# Patient Record
Sex: Female | Born: 1985 | Race: Black or African American | Hispanic: No | Marital: Single | Smoking: Former smoker
Health system: Southern US, Community
[De-identification: ages and names within clinical notes are randomized; demographics above are authoritative.]

## PROBLEM LIST (undated history)

## (undated) ENCOUNTER — Inpatient Hospital Stay (HOSPITAL_COMMUNITY): Payer: Self-pay

## (undated) DIAGNOSIS — K219 Gastro-esophageal reflux disease without esophagitis: Secondary | ICD-10-CM

## (undated) DIAGNOSIS — B001 Herpesviral vesicular dermatitis: Secondary | ICD-10-CM

## (undated) HISTORY — DX: Herpesviral vesicular dermatitis: B00.1

## (undated) HISTORY — PX: NO PAST SURGERIES: SHX2092

---
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
## 2006-10-11 ENCOUNTER — Emergency Department (HOSPITAL_COMMUNITY): Admission: EM | Admit: 2006-10-11 | Discharge: 2006-10-11 | Payer: Self-pay | Admitting: Emergency Medicine

## 2006-11-07 ENCOUNTER — Emergency Department (HOSPITAL_COMMUNITY): Admission: EM | Admit: 2006-11-07 | Discharge: 2006-11-07 | Payer: Self-pay | Admitting: Emergency Medicine

## 2007-02-05 ENCOUNTER — Emergency Department (HOSPITAL_COMMUNITY): Admission: EM | Admit: 2007-02-05 | Discharge: 2007-02-05 | Payer: Self-pay | Admitting: Emergency Medicine

## 2007-04-12 ENCOUNTER — Emergency Department (HOSPITAL_COMMUNITY): Admission: EM | Admit: 2007-04-12 | Discharge: 2007-04-12 | Payer: Self-pay | Admitting: Emergency Medicine

## 2010-01-29 ENCOUNTER — Emergency Department (HOSPITAL_COMMUNITY): Admission: EM | Admit: 2010-01-29 | Discharge: 2010-01-29 | Payer: Self-pay | Admitting: Emergency Medicine

## 2010-09-17 LAB — RAPID STREP SCREEN (MED CTR MEBANE ONLY): Streptococcus, Group A Screen (Direct): NEGATIVE

## 2010-09-17 LAB — STREP A DNA PROBE

## 2011-04-17 LAB — GC/CHLAMYDIA PROBE AMP, GENITAL
Chlamydia, DNA Probe: NEGATIVE
GC Probe Amp, Genital: NEGATIVE

## 2011-04-17 LAB — WET PREP, GENITAL: Trich, Wet Prep: NONE SEEN

## 2013-05-09 ENCOUNTER — Ambulatory Visit (INDEPENDENT_AMBULATORY_CARE_PROVIDER_SITE_OTHER): Payer: BC Managed Care – PPO | Admitting: Physician Assistant

## 2013-05-09 VITALS — BP 100/70 | HR 91 | Temp 98.9°F | Resp 16 | Ht 65.25 in | Wt 209.8 lb

## 2013-05-09 DIAGNOSIS — J329 Chronic sinusitis, unspecified: Secondary | ICD-10-CM

## 2013-05-09 DIAGNOSIS — B009 Herpesviral infection, unspecified: Secondary | ICD-10-CM

## 2013-05-09 DIAGNOSIS — J309 Allergic rhinitis, unspecified: Secondary | ICD-10-CM

## 2013-05-09 DIAGNOSIS — B001 Herpesviral vesicular dermatitis: Secondary | ICD-10-CM

## 2013-05-09 MED ORDER — AMOXICILLIN-POT CLAVULANATE 875-125 MG PO TABS: 1.0000 | ORAL_TABLET | Freq: Two times a day (BID) | ORAL | Status: DC

## 2013-05-09 MED ORDER — IPRATROPIUM BROMIDE 0.03 % NA SOLN: 2.0000 | Freq: Two times a day (BID) | NASAL | Status: DC

## 2013-05-09 MED ORDER — MUPIROCIN 2 % EX OINT: 1.0000 "application " | TOPICAL_OINTMENT | Freq: Three times a day (TID) | CUTANEOUS | Status: DC

## 2013-05-09 MED ORDER — FLUTICASONE PROPIONATE 50 MCG/ACT NA SUSP: 2.0000 | Freq: Every day | NASAL | Status: DC

## 2013-05-09 MED ORDER — GUAIFENESIN ER 1200 MG PO TB12: 1.0000 | ORAL_TABLET | Freq: Two times a day (BID) | ORAL | Status: DC | PRN

## 2013-05-09 NOTE — Progress Notes (Signed)
  Subjective:    Patient ID: Cindy Mendez, female    DOB: February 15, 1986, 27 y.o.   MRN: [id number]  HPI This 27 y.o. female presents for evaluation of respiratory illness and fever blisters. Developed severe nasal congestion on 05/03/2013, with progressive symptoms: sinus pressure and congestion, facial pain, HA.  OTC products, including nasal decongestant spray, without relief.  ST.  Feels flushed, subjective fever/chills.    Has a history of allergies, but hasn't been using her allergy meds: loratadine  She took 2 2-gram doses of Valtrex 12 hours apart with the onset of her fever blister, but hasn't had any improvement.  Review of Systems As above.    Objective:   Physical Exam  Vitals reviewed. Constitutional: She is oriented to person, place, and time. Vital signs are normal. She appears well-developed and well-nourished. She is active and cooperative. She appears ill. No distress.  HENT:  Head: Normocephalic and atraumatic.  Right Ear: Hearing, tympanic membrane, external ear and ear canal normal.  Left Ear: Hearing, tympanic membrane, external ear and ear canal normal.  Nose: Mucosal edema and rhinorrhea present.  No foreign bodies. Right sinus exhibits no maxillary sinus tenderness and no frontal sinus tenderness. Left sinus exhibits no maxillary sinus tenderness and no frontal sinus tenderness.  Mouth/Throat: Uvula is midline, oropharynx is clear and moist and mucous membranes are normal. No uvula swelling. No oropharyngeal exudate.  Small piercing in the RIGHT nare. Large cluster of vesicles on the upper lip extending from the center to the LEFT, associated with lip swelling and erythema.  Eyes: Conjunctivae and EOM are normal. Pupils are equal, round, and reactive to light. Right eye exhibits no discharge. Left eye exhibits no discharge. No scleral icterus.  Neck: Trachea normal, normal range of motion and full passive range of motion without pain. Neck supple. No mass and no  thyromegaly present.  Cardiovascular: Normal rate, regular rhythm and normal heart sounds.   Pulmonary/Chest: Effort normal and breath sounds normal.  Lymphadenopathy:       Head (right side): No submandibular, no tonsillar, no preauricular, no posterior auricular and no occipital adenopathy present.       Head (left side): No submandibular, no tonsillar, no preauricular and no occipital adenopathy present.    She has no cervical adenopathy.       Right: No supraclavicular adenopathy present.       Left: No supraclavicular adenopathy present.  Neurological: She is alert and oriented to person, place, and time. She has normal strength. No cranial nerve deficit or sensory deficit.  Skin: Skin is warm, dry and intact. No rash noted.  Psychiatric: She has a normal mood and affect. Her speech is normal and behavior is normal.          Assessment & Plan:  Sinusitis - Plan: ipratropium (ATROVENT) 0.03 % nasal spray, Guaifenesin (MUCINEX MAXIMUM STRENGTH) 1200 MG TB12, amoxicillin-clavulanate (AUGMENTIN) 875-125 MG per tablet  Herpes labialis - Plan: mupirocin ointment (BACTROBAN) 2 %, Valtrex prn  Allergic rhinitis - Plan: fluticasone (FLONASE) 50 MCG/ACT nasal spray  Supportive care.  Anticipatory guidance.  RTC if symptoms worsen/persist.  Fernande Bras, PA-C Physician Assistant-Certified [facility]

## 2013-05-09 NOTE — Patient Instructions (Signed)
Use a dose of the nasal decongestant in each nostril, and wait 10 minutes, then repeat. (Use 1-2 times daily for 3 days MAX) Wait 10 minutes, then use 2 sprays of the Atrovent (ipratropium) nasal spray in each nostril (Use 2 times each day until your symptoms improve.  You may restart it if your symptoms recur). Wait 10 minutes, use 2 sprays of the Flonase nasal spray (use daily to keep allergies well controlled).

## 2013-05-31 ENCOUNTER — Ambulatory Visit (INDEPENDENT_AMBULATORY_CARE_PROVIDER_SITE_OTHER): Payer: BC Managed Care – PPO | Admitting: Family Medicine

## 2013-05-31 VITALS — BP 110/70 | HR 88 | Temp 99.2°F | Resp 18 | Ht 64.38 in | Wt 204.0 lb

## 2013-05-31 DIAGNOSIS — J209 Acute bronchitis, unspecified: Secondary | ICD-10-CM

## 2013-05-31 MED ORDER — ALBUTEROL SULFATE HFA 108 (90 BASE) MCG/ACT IN AERS: 2.0000 | INHALATION_SPRAY | Freq: Four times a day (QID) | RESPIRATORY_TRACT | Status: AC | PRN

## 2013-05-31 MED ORDER — PREDNISONE 20 MG PO TABS: ORAL_TABLET | ORAL | Status: DC

## 2013-05-31 NOTE — Progress Notes (Signed)
This chart was scribed for Elvina Sidle, MD  by Arlan Organ, ED Scribe. This patient was seen in room Room 2 and the patient's care was started 10:40 AM.     Cindy Mendez MRN: [id number], DOB: 1985/08/10, 27 y.o. Date of Encounter: 05/31/2013, 10:29 AM  Primary Physician: No primary provider on file.  Chief Complaint:  Chief Complaint  Patient presents with  . Cough    yellow productive cough x 8-9 days  . Wheezing    HPI: 27 y.o. year old female presents with a 7 day history of gradual onset, gradually worsening, constant chest congestion, wheezing, and a productive cough consisting of yellow sputum. Afebrile. Has not tried OTC cold preps without success. No GI complaints. Appetite normal. Pt was recently seen on 05/09/13 for nasal congestion, and was given antibiotics with relief. She states her nasal symptoms are resolved, but feels her symptoms may have moved to her chest.  No sick contacts or recent travels.   No leg trauma, sedentary periods, h/o cancer. Pt states she smokes tobacco socially.  Pt currently works at [employer].  Past Medical History  Diagnosis Date  . Herpes simplex labialis      Home Meds: Prior to Admission medications   Medication Sig Start Date End Date Taking? Authorizing Provider  fluticasone (FLONASE) 50 MCG/ACT nasal spray Place 2 sprays into both nostrils daily. 05/09/13  Yes Chelle Tessa Lerner, PA-C  levonorgestrel-ethinyl estradiol (SEASONALE,INTROVALE,JOLESSA) 0.15-0.03 MG tablet Take 1 tablet by mouth daily.   Yes Historical Provider, MD  amoxicillin-clavulanate (AUGMENTIN) 875-125 MG per tablet Take 1 tablet by mouth 2 (two) times daily. 05/09/13   Chelle S Jeffery, PA-C  Guaifenesin (MUCINEX MAXIMUM STRENGTH) 1200 MG TB12 Take 1 tablet (1,200 mg total) by mouth every 12 (twelve) hours as needed. 05/09/13   Chelle S Jeffery, PA-C  ipratropium (ATROVENT) 0.03 % nasal spray Place 2 sprays into the nose 2 (two) times daily. 05/09/13    Chelle Tessa Lerner, PA-C  mupirocin ointment (BACTROBAN) 2 % Apply 1 application topically 3 (three) times daily. 05/09/13   Chelle S Jeffery, PA-C  valACYclovir (VALTREX) 1000 MG tablet Take 1,000 mg by mouth 2 (two) times daily. X 2 doses; 1/2 tablet daily for prevention    Historical Provider, MD    Allergies: No Known Allergies  History   Social History  . Marital Status: Single    Spouse Name: n/a    Number of Children: 0  . Years of Education: N/A   Occupational History  . bartender/server    Social History Main Topics  . Smoking status: Never Smoker   . Smokeless tobacco: Never Used  . Alcohol Use: 1 - 2 oz/week    2-4 drink(s) per week  . Drug Use: 4.00 per week    Special: Marijuana  . Sexual Activity: Not on file   Other Topics Concern  . Not on file   Social History Narrative   Lives with her mother and stepfather, and her brother.  Minimal contact with her father.       Review of Systems: Constitutional: negative for chills, fever, night sweats or weight changes Cardiovascular: negative for chest pain or palpitations Respiratory: negative for hemoptysis, or shortness of breath. Positive wheezing, and cough Abdominal: negative for abdominal pain, nausea, vomiting or diarrhea Dermatological: negative for rash Neurologic: negative for headache   Physical Exam: Blood pressure 110/70, pulse 88, temperature 99.2 F (37.3 C), temperature source Oral, resp. rate 18, height 5' 4.38" (1.635  m), weight 204 lb (92.534 kg), last menstrual period 03/08/2013, SpO2 98.00%., Body mass index is 34.62 kg/(m^2). General: Well developed, well nourished, in no acute distress. Head: Normocephalic, atraumatic, eyes without discharge, sclera non-icteric, nares are congested. Bilateral auditory canals clear, TM's are without perforation, pearly grey with reflective cone of light bilaterally. No sinus TTP. Oral cavity moist, dentition normal. Posterior pharynx with post nasal drip and  mild erythema. No peritonsillar abscess or tonsillar exudate. Neck: Supple. No thyromegaly. Full ROM. No lymphadenopathy. Lungs: Coarse breath sounds bilaterally without wheezes, rales, or rhonchi. Breathing is unlabored.  Heart: RRR with S1 S2. No murmurs, rubs, or gallops appreciated. Msk:  Strength and tone normal for age. Extremities: No clubbing or cyanosis. No edema. Neuro: Alert and oriented X 3. Moves all extremities spontaneously. CNII-XII grossly in tact. Psych:  Responds to questions appropriately with a normal affect.   Patient has inspiratory and expiratory wheezes bilaterally. Acute bronchitis - Plan: predniSONE (DELTASONE) 20 MG tablet, albuterol (PROVENTIL HFA;VENTOLIN HFA) 108 (90 BASE) MCG/ACT inhaler    ASSESSMENT AND PLAN:  27 y.o. year old female with bronchitis. -Acute bronchitis - Plan: predniSONE (DELTASONE) 20 MG tablet, albuterol (PROVENTIL HFA;VENTOLIN HFA) 108 (90 BASE) MCG/ACT inhaler   -Tylenol/Motrin prn -Rest/fluids -RTC precautions -RTC 3-5 days if no improvement  Signed, Elvina Sidle, MD 05/31/2013 10:29 AM

## 2013-06-19 ENCOUNTER — Emergency Department (HOSPITAL_COMMUNITY)
Admission: EM | Admit: 2013-06-19 | Discharge: 2013-06-19 | Disposition: A | Discharge status: Home / Self Care | Payer: BC Managed Care – PPO | Attending: Emergency Medicine | Admitting: Emergency Medicine

## 2013-06-19 ENCOUNTER — Encounter (HOSPITAL_COMMUNITY): Payer: Self-pay | Admitting: Emergency Medicine

## 2013-06-19 ENCOUNTER — Emergency Department (HOSPITAL_COMMUNITY): Payer: BC Managed Care – PPO

## 2013-06-19 DIAGNOSIS — R071 Chest pain on breathing: Secondary | ICD-10-CM | POA: Insufficient documentation

## 2013-06-19 DIAGNOSIS — J45909 Unspecified asthma, uncomplicated: Secondary | ICD-10-CM

## 2013-06-19 DIAGNOSIS — J209 Acute bronchitis, unspecified: Secondary | ICD-10-CM | POA: Insufficient documentation

## 2013-06-19 DIAGNOSIS — J40 Bronchitis, not specified as acute or chronic: Secondary | ICD-10-CM

## 2013-06-19 DIAGNOSIS — J3489 Other specified disorders of nose and nasal sinuses: Secondary | ICD-10-CM | POA: Insufficient documentation

## 2013-06-19 DIAGNOSIS — Z79899 Other long term (current) drug therapy: Secondary | ICD-10-CM | POA: Insufficient documentation

## 2013-06-19 DIAGNOSIS — R062 Wheezing: Secondary | ICD-10-CM | POA: Insufficient documentation

## 2013-06-19 DIAGNOSIS — Z8619 Personal history of other infectious and parasitic diseases: Secondary | ICD-10-CM | POA: Insufficient documentation

## 2013-06-19 DIAGNOSIS — Z8709 Personal history of other diseases of the respiratory system: Secondary | ICD-10-CM | POA: Insufficient documentation

## 2013-06-19 MED ORDER — IPRATROPIUM BROMIDE 0.02 % IN SOLN
0.5000 mg | Freq: Once | RESPIRATORY_TRACT | Status: AC
  Administered 2013-06-19: 0.5 mg via RESPIRATORY_TRACT
  Filled 2013-06-19: qty 2.5

## 2013-06-19 MED ORDER — METHYLPREDNISOLONE SODIUM SUCC 125 MG IJ SOLR
125.0000 mg | Freq: Once | INTRAMUSCULAR | Status: AC
  Administered 2013-06-19: 125 mg via INTRAMUSCULAR
  Filled 2013-06-19: qty 2

## 2013-06-19 MED ORDER — PREDNISONE 20 MG PO TABS: ORAL_TABLET | ORAL | Status: DC

## 2013-06-19 MED ORDER — ALBUTEROL SULFATE (5 MG/ML) 0.5% IN NEBU
5.0000 mg | INHALATION_SOLUTION | Freq: Once | RESPIRATORY_TRACT | Status: AC
  Administered 2013-06-19: 5 mg via RESPIRATORY_TRACT
  Filled 2013-06-19: qty 1

## 2013-06-19 MED ORDER — AZITHROMYCIN 250 MG PO TABS: 250.0000 mg | ORAL_TABLET | Freq: Every day | ORAL | Status: DC

## 2013-06-19 NOTE — ED Notes (Signed)
Pt reports she feels better and can breathe easier.

## 2013-06-19 NOTE — ED Provider Notes (Signed)
CSN: [id number]     Arrival date & time 06/19/13  1141 History  This chart was scribed for non-physician practitioner, Raymon Mutton, PA-C working with Raeford Razor, MD by Greggory Stallion, ED scribe. This patient was seen in room TR10C/TR10C and the patient's care was started at 3:43 PM.   Chief Complaint  Patient presents with  . Bronchitis   The history is provided by the patient. No language interpreter was used.   HPI Comments: Cindy Mendez is a 27 y.o. female who presents to the Emergency Department complaining of productive cough. She states she had a sinus infection, then was diagnosed with bronchitis one month ago and given an inhaler and prednisone. Pt finished the prednisone two weeks ago and states it provided some relief. Patient reported that when she coughs she has a sharp pain to the left side of her ribs, stated that it only occurs when she coughs. She has also had wheezing and used an inhaler with temporary relief. She uses the albuterol inhaler 3 times per day. Pt has taken Advil with no relief. Denies fever, chills, congestion, nausea, emesis, diarrhea, chest pain, sore throat, difficulty swallowing, fall, injury.   Past Medical History  Diagnosis Date  . Herpes simplex labialis    History reviewed. No pertinent past surgical history. Family History  Problem Relation Age of Onset  . Diabetes Maternal Grandfather   . Fibromyalgia Mother   . Asthma Father    History  Substance Use Topics  . Smoking status: Never Smoker   . Smokeless tobacco: Never Used  . Alcohol Use: 1 - 2 oz/week    2-4 drink(s) per week   OB History   Grav Para Term Preterm Abortions TAB SAB Ect Mult Living                 Review of Systems  Constitutional: Negative for fever and chills.  HENT: Negative for congestion, sore throat and trouble swallowing.   Respiratory: Positive for cough and wheezing.   Cardiovascular: Negative for chest pain.  Gastrointestinal: Negative for nausea,  vomiting, abdominal pain and diarrhea.  Genitourinary: Positive for flank pain (rib).  All other systems reviewed and are negative.    Allergies  Review of patient's allergies indicates no known allergies.  Home Medications   Current Outpatient Rx  Name  Route  Sig  Dispense  Refill  . albuterol (PROVENTIL HFA;VENTOLIN HFA) 108 (90 BASE) MCG/ACT inhaler   Inhalation   Inhale 2 puffs into the lungs every 6 (six) hours as needed for wheezing or shortness of breath.   1 Inhaler   0   . fluticasone (FLONASE) 50 MCG/ACT nasal spray   Each Nare   Place 2 sprays into both nostrils daily.         Marland Kitchen levonorgestrel-ethinyl estradiol (SEASONALE,INTROVALE,JOLESSA) 0.15-0.03 MG tablet   Oral   Take 1 tablet by mouth daily.         . valACYclovir (VALTREX) 1000 MG tablet   Oral   Take 2,000 mg by mouth See admin instructions. Once when feeling a cold sore coming on.         Marland Kitchen azithromycin (ZITHROMAX) 250 MG tablet   Oral   Take 1 tablet (250 mg total) by mouth daily. Take first 2 tablets together, then 1 every day until finished.   6 tablet   0   . predniSONE (DELTASONE) 20 MG tablet      3 tabs po day one, then 2 tabs daily x  4 days   11 tablet   0    BP 159/100  Pulse 99  Temp(Src) 98.9 F (37.2 C) (Oral)  Resp 18  Ht 5\' 3"  (1.6 m)  Wt 208 lb 8 oz (94.575 kg)  BMI 36.94 kg/m2  SpO2 99%  LMP 06/03/2013 Physical Exam  Nursing note and vitals reviewed. Constitutional: She is oriented to person, place, and time. She appears well-developed and well-nourished. No distress.  HENT:  Head: Normocephalic and atraumatic.  Mouth/Throat: Oropharynx is clear and moist. No oropharyngeal exudate.  Negative swelling, erythema, inflammation, lesions, sores, petechiae, exudate noted to the posterior oropharynx and tonsils bilaterally. Uvula midline, symmetrical elevation. Negative uvula swelling.   Eyes: Conjunctivae and EOM are normal. Pupils are equal, round, and reactive to  light. Right eye exhibits no discharge. Left eye exhibits no discharge.  Neck: Normal range of motion. Neck supple. No tracheal deviation present.  Negative neck stiffness Negative nuchal rigidity Negative cervical LAD  Cardiovascular: Normal rate, regular rhythm and normal heart sounds.  Exam reveals no gallop and no friction rub.   No murmur heard. Pulses:      Radial pulses are 2+ on the right side, and 2+ on the left side.  Pulmonary/Chest: Effort normal. No respiratory distress. She has wheezes in the right upper field, the right lower field, the left upper field and the left lower field. She has no rales.    Bilateral inspiratory wheezes to upper and lower lobes bilaetrally.  Mild discomfort noted upon palpation to the left lower ribs - mid-axillary region. Good lung expansion Patient is able to speak in full sentences without difficulty Negative use of accessory muscles Negative respiratory distress noted  Abdominal: Bowel sounds are normal.  Musculoskeletal: Normal range of motion.  Full ROM to upper and lower extremities bilaterally without difficulty noted.  Lymphadenopathy:    She has no cervical adenopathy.  Neurological: She is alert and oriented to person, place, and time. No cranial nerve deficit. She exhibits normal muscle tone. Coordination normal.  Skin: Skin is warm and dry. She is not diaphoretic.  Psychiatric: She has a normal mood and affect. Her behavior is normal.    ED Course  Procedures (including critical care time)  DIAGNOSTIC STUDIES: Oxygen Saturation is 99% on RA, normal by my interpretation.    COORDINATION OF CARE: 3:48 PM-Discussed treatment plan which includes breathing treatment with pt at bedside and pt agreed to plan.   Labs Review Labs Reviewed - No data to display Imaging Review Dg Chest 2 View  06/19/2013   CLINICAL DATA:  Cough and chest pain  EXAM: CHEST  2 VIEW  COMPARISON:  None.  FINDINGS: The heart size and mediastinal contours  are within normal limits. Both lungs are clear. The visualized skeletal structures are unremarkable.  IMPRESSION: No active cardiopulmonary disease.   Electronically Signed   By: Alcide Clever M.D.   On: 06/19/2013 13:21    EKG Interpretation   None      Date: 06/20/2013  Rate: 95  Rhythm: normal sinus rhythm  QRS Axis: normal  Intervals: normal  ST/T Wave abnormalities: normal  Conduction Disutrbances:none  Narrative Interpretation:   Old EKG Reviewed: none available    MDM   1. Bronchitis   2. Asthma    Medications  albuterol (PROVENTIL) (5 MG/ML) 0.5% nebulizer solution 5 mg (5 mg Nebulization Given 06/19/13 1610)  ipratropium (ATROVENT) nebulizer solution 0.5 mg (0.5 mg Nebulization Given 06/19/13 1610)  methylPREDNISolone sodium succinate (SOLU-MEDROL) 125 mg/2  mL injection 125 mg (125 mg Intramuscular Given 06/19/13 1611)   Filed Vitals:   06/19/13 1147 06/19/13 1639  BP: 159/100 113/79  Pulse: 99 96  Temp: 98.9 F (37.2 C)   TempSrc: Oral   Resp: 18 20  Height: 5\' 3"  (1.6 m)   Weight: 208 lb 8 oz (94.575 kg)   SpO2: 99% 100%   I personally performed the services described in this documentation, which was scribed in my presence. The recorded information has been reviewed and is accurate.  Patient presenting to the ED with nasal congestion, productive cough. Patient reported that [facility] she was given albuterol inhaler and prednisone that aided her symptoms. Patient reported mild left sided rib discomfort, described as a sharp pain, only when she coughs.  Alert and oriented. GCS 15. Heart rate and rhythm normal. Pulses palpable and strong, radial 2+ bilaterally. Lungs noted to have bilateral inspiratory wheezes to upper and lower lobes. Good chest wall expansion. Mild discomfort upon palpation to the left lower ribs - negative crepitus noted. Negative neck stiffness, negative cervical LAD. Unremarkable oral exam.   Chest xray negative for fracture and cardiopulmonary disease.  Doubt pneumonia. Doubt rib fracture. Suspicion to be URI, with bronchitis since patient has history of asthma. Rib discomfort mainly muscular in nature - most likely costochondritis. Patient given nebulizer treatment in ED setting - breathing easier. Patient stable, afebrile. Negative respiratory distress. Pulse ox 100% on room air. Discharged patient with antibiotics and prednisone. Referred patient to urgent care center. Discussed with patient to rest and stay hydrated. Discussed with patient to continue to monitor symptoms and if symptoms are to worsen or change to report back to the ED - strict return instructions given.  Patient agreed to plan of care, understood, all questions answered.   Raymon Mutton, PA-C 06/20/13 1217

## 2013-06-19 NOTE — ED Notes (Signed)
Pt ambulated around full circle of ED; sats stayed in 98%-100%.

## 2013-06-19 NOTE — ED Notes (Signed)
Pt reports recent sinus infection and diagnosed with bronchitis but is still having left rib pain and cough. Pain increases with breathing, cough and movement. Airway intact, no resp distress noted.

## 2013-06-19 NOTE — ED Notes (Signed)
Pt states she was diagnosed with "bronchitis" 3 weeks ago. States has had violent cough the past 3 weeks. C/o left lower chest pain, sharp, with cough and movement.

## 2013-06-19 NOTE — ED Notes (Signed)
PT ambulated with baseline gait; VSS; A&Ox3; no signs of distress; respirations even and unlabored; skin warm and dry; no questions upon discharge.  

## 2013-06-27 NOTE — ED Provider Notes (Signed)
Medical screening examination/treatment/procedure(s) were performed by non-physician practitioner and as supervising physician I was immediately available for consultation/collaboration.  EKG Interpretation    Date/Time:  Thursday June 19 2013 11:52:15 EST Ventricular Rate:  95 PR Interval:  124 QRS Duration: 70 QT Interval:  356 QTC Calculation: 447 R Axis:   82 Text Interpretation:  Normal sinus rhythm Normal ECG ED PHYSICIAN INTERPRETATION AVAILABLE IN [facility] Confirmed by TEST, RECORD ([id number]) on 06/21/2013 8:50:25 AM             Raeford Razor, MD 06/27/13 1710

## 2014-06-15 ENCOUNTER — Inpatient Hospital Stay (HOSPITAL_COMMUNITY): Payer: Medicaid Other

## 2014-06-15 ENCOUNTER — Encounter (HOSPITAL_COMMUNITY): Payer: Self-pay | Admitting: *Deleted

## 2014-06-15 ENCOUNTER — Inpatient Hospital Stay (HOSPITAL_COMMUNITY)
Admission: AD | Admit: 2014-06-15 | Discharge: 2014-06-15 | Disposition: A | Discharge status: Home / Self Care | Payer: Medicaid Other | Source: Ambulatory Visit | Attending: Family Medicine | Admitting: Family Medicine

## 2014-06-15 DIAGNOSIS — R1032 Left lower quadrant pain: Secondary | ICD-10-CM | POA: Diagnosis present

## 2014-06-15 DIAGNOSIS — O3481 Maternal care for other abnormalities of pelvic organs, first trimester: Secondary | ICD-10-CM | POA: Diagnosis not present

## 2014-06-15 DIAGNOSIS — N8320 Unspecified ovarian cysts: Secondary | ICD-10-CM | POA: Diagnosis not present

## 2014-06-15 DIAGNOSIS — Z3A09 9 weeks gestation of pregnancy: Secondary | ICD-10-CM | POA: Insufficient documentation

## 2014-06-15 DIAGNOSIS — N83209 Unspecified ovarian cyst, unspecified side: Secondary | ICD-10-CM

## 2014-06-15 DIAGNOSIS — O26891 Other specified pregnancy related conditions, first trimester: Secondary | ICD-10-CM

## 2014-06-15 DIAGNOSIS — R102 Pelvic and perineal pain: Secondary | ICD-10-CM

## 2014-06-15 LAB — CBC
HEMATOCRIT: 36.8 % (ref 36.0–46.0)
HEMOGLOBIN: 12.3 g/dL (ref 12.0–15.0)
MCH: 28.5 pg (ref 26.0–34.0)
MCHC: 33.4 g/dL (ref 30.0–36.0)
MCV: 85.4 fL (ref 78.0–100.0)
Platelets: 347 10*3/uL (ref 150–400)
RBC: 4.31 MIL/uL (ref 3.87–5.11)
RDW: 13.5 % (ref 11.5–15.5)
WBC: 13.9 10*3/uL — AB (ref 4.0–10.5)

## 2014-06-15 LAB — URINALYSIS, ROUTINE W REFLEX MICROSCOPIC
Bilirubin Urine: NEGATIVE
GLUCOSE, UA: NEGATIVE mg/dL
HGB URINE DIPSTICK: NEGATIVE
Ketones, ur: NEGATIVE mg/dL
LEUKOCYTES UA: NEGATIVE
Nitrite: NEGATIVE
PH: 8 (ref 5.0–8.0)
PROTEIN: NEGATIVE mg/dL
SPECIFIC GRAVITY, URINE: 1.02 (ref 1.005–1.030)
Urobilinogen, UA: 0.2 mg/dL (ref 0.0–1.0)

## 2014-06-15 LAB — POCT PREGNANCY, URINE: PREG TEST UR: POSITIVE — AB

## 2014-06-15 LAB — HCG, QUANTITATIVE, PREGNANCY: HCG, BETA CHAIN, QUANT, S: 71381 m[IU]/mL — AB (ref <5)

## 2014-06-15 NOTE — Discharge Instructions (Signed)
Ovarian Cyst An ovarian cyst is a fluid-filled sac that forms on an ovary. The ovaries are small organs that produce eggs in women. Various types of cysts can form on the ovaries. Most are not cancerous. Many do not cause problems, and they often go away on their own. Some may cause symptoms and require treatment. Common types of ovarian cysts include:  Functional cysts--These cysts may occur every month during the menstrual cycle. This is normal. The cysts usually go away with the next menstrual cycle if the woman does not get pregnant. Usually, there are no symptoms with a functional cyst.  Endometrioma cysts--These cysts form from the tissue that lines the uterus. They are also called "chocolate cysts" because they become filled with blood that turns brown. This type of cyst can cause pain in the lower abdomen during intercourse and with your menstrual period.  Cystadenoma cysts--This type develops from the cells on the outside of the ovary. These cysts can get very big and cause lower abdomen pain and pain with intercourse. This type of cyst can twist on itself, cut off its blood supply, and cause severe pain. It can also easily rupture and cause a lot of pain.  Dermoid cysts--This type of cyst is sometimes found in both ovaries. These cysts may contain different kinds of body tissue, such as skin, teeth, hair, or cartilage. They usually do not cause symptoms unless they get very big.  Theca lutein cysts--These cysts occur when too much of a certain hormone (human chorionic gonadotropin) is produced and overstimulates the ovaries to produce an egg. This is most common after procedures used to assist with the conception of a baby (in vitro fertilization). CAUSES   Fertility drugs can cause a condition in which multiple large cysts are formed on the ovaries. This is called ovarian hyperstimulation syndrome.  A condition called polycystic ovary syndrome can cause hormonal imbalances that can lead to  nonfunctional ovarian cysts. SIGNS AND SYMPTOMS  Many ovarian cysts do not cause symptoms. If symptoms are present, they may include:  Pelvic pain or pressure.  Pain in the lower abdomen.  Pain during sexual intercourse.  Increasing girth (swelling) of the abdomen.  Abnormal menstrual periods.  Increasing pain with menstrual periods.  Stopping having menstrual periods without being pregnant. DIAGNOSIS  These cysts are commonly found during a routine or annual pelvic exam. Tests may be ordered to find out more about the cyst. These tests may include:  Ultrasound.  X-ray of the pelvis.  CT scan.  MRI.  Blood tests. TREATMENT  Many ovarian cysts go away on their own without treatment. Your health care provider may want to check your cyst regularly for 2-3 months to see if it changes. For women in menopause, it is particularly important to monitor a cyst closely because of the higher rate of ovarian cancer in menopausal women. When treatment is needed, it may include any of the following:  A procedure to drain the cyst (aspiration). This may be done using a long needle and ultrasound. It can also be done through a laparoscopic procedure. This involves using a thin, lighted tube with a tiny camera on the end (laparoscope) inserted through a small incision.  Surgery to remove the whole cyst. This may be done using laparoscopic surgery or an open surgery involving a larger incision in the lower abdomen.  Hormone treatment or birth control pills. These methods are sometimes used to help dissolve a cyst. HOME CARE INSTRUCTIONS   Only take over-the-counter   or prescription medicines as directed by your health care provider.  Follow up with your health care provider as directed.  Get regular pelvic exams and Pap tests. SEEK MEDICAL CARE IF:   Your periods are late, irregular, or painful, or they stop.  Your pelvic pain or abdominal pain does not go away.  Your abdomen becomes  larger or swollen.  You have pressure on your bladder or trouble emptying your bladder completely.  You have pain during sexual intercourse.  You have feelings of fullness, pressure, or discomfort in your stomach.  You lose weight for no apparent reason.  You feel generally ill.  You become constipated.  You lose your appetite.  You develop acne.  You have an increase in body and facial hair.  You are gaining weight, without changing your exercise and eating habits.  You think you are pregnant. SEEK IMMEDIATE MEDICAL CARE IF:   You have increasing abdominal pain.  You feel sick to your stomach (nauseous), and you throw up (vomit).  You develop a fever that comes on suddenly.  You have abdominal pain during a bowel movement.  Your menstrual periods become heavier than usual. MAKE SURE YOU:  Understand these instructions.  Will watch your condition.  Will get help right away if you are not doing well or get worse. Document Released: 06/19/2005 Document Revised: 06/24/2013 Document Reviewed: 02/24/2013 [employer] Patient Information 2015 [employer]. This information is not intended to replace advice given to you by your health care provider. Make sure you discuss any questions you have with your health care provider.  

## 2014-06-15 NOTE — MAU Provider Note (Signed)
History     CSN: [id number]  Arrival date and time: 06/15/14 1138   First Provider Initiated Contact with Patient 06/15/14 1521      Chief Complaint  Patient presents with  . Flank Pain   HPI 28yo G3P0020 at 8944w5d by LMP seen in MAU due to LLQ/pelvic pain that started last night.  Pain is sharp and constant and has improved a little since it started.  She had an episode of this about 1 week ago as well, except more severe.  Pain is still quite intense.  Pain radiates up side.  Worse with movement, coughing, sneezing.  OB History    Gravida Para Term Preterm AB TAB SAB Ectopic Multiple Living   3    2     0      Past Medical History  Diagnosis Date  . Herpes simplex labialis     Past Surgical History  Procedure Laterality Date  . No past surgeries      Family History  Problem Relation Age of Onset  . Diabetes Maternal Grandfather   . Fibromyalgia Mother   . Asthma Father     History  Substance Use Topics  . Smoking status: Never Smoker   . Smokeless tobacco: Never Used  . Alcohol Use: 1.0 - 2.0 oz/week    2-4 Not specified per week    Allergies: No Known Allergies  Prescriptions prior to admission  Medication Sig Dispense Refill Last Dose  . albuterol (PROVENTIL HFA;VENTOLIN HFA) 108 (90 BASE) MCG/ACT inhaler Inhale 2 puffs into the lungs every 6 (six) hours as needed for wheezing or shortness of breath. 1 Inhaler 0 06/18/2013 at Unknown time  . azithromycin (ZITHROMAX) 250 MG tablet Take 1 tablet (250 mg total) by mouth daily. Take first 2 tablets together, then 1 every day until finished. 6 tablet 0   . fluticasone (FLONASE) 50 MCG/ACT nasal spray Place 2 sprays into both nostrils daily.   Past Month at Unknown time  . levonorgestrel-ethinyl estradiol (SEASONALE,INTROVALE,JOLESSA) 0.15-0.03 MG tablet Take 1 tablet by mouth daily.   06/18/2013 at Unknown time  . predniSONE (DELTASONE) 20 MG tablet 3 tabs po day one, then 2 tabs daily x 4 days 11 tablet 0   .  valACYclovir (VALTREX) 1000 MG tablet Take 2,000 mg by mouth See admin instructions. Once when feeling a cold sore coming on.   2-3 weeks    Review of Systems  Constitutional: Negative for fever, chills, weight loss and diaphoresis.  Eyes: Negative for blurred vision, double vision and photophobia.  Respiratory: Negative for cough, sputum production, shortness of breath and wheezing.   Cardiovascular: Negative for chest pain, palpitations and claudication.  Gastrointestinal: Negative for heartburn, nausea, vomiting, diarrhea and constipation.  Genitourinary: Negative for dysuria.  Neurological: Negative for dizziness, tingling, tremors, weakness and headaches.   Physical Exam   Blood pressure 105/71, pulse 84, temperature 98.6 F (37 C), temperature source Oral, resp. rate 17, height 5' 4.5" (1.638 m), weight 222 lb 4 oz (100.812 kg), last menstrual period 04/08/2014.  Physical Exam  Constitutional: She appears well-developed and well-nourished.  HENT:  Head: Normocephalic and atraumatic.  Cardiovascular: Normal rate, regular rhythm and normal heart sounds.  Exam reveals no gallop and no friction rub.   No murmur heard. Respiratory: Effort normal and breath sounds normal. No respiratory distress. She has no wheezes. She has no rales. She exhibits no tenderness.  GI: Soft. She exhibits no distension and no mass. There is tenderness (LLQ). There  is no rebound and no guarding.  Neurological: She is alert.  Skin: Skin is warm and dry. No rash noted. No erythema. No pallor.  Psychiatric: She has a normal mood and affect. Her behavior is normal. Judgment and thought content normal.   Results for orders placed or performed during the hospital encounter of 06/15/14 (from the past 24 hour(s))  Urinalysis, Routine w reflex microscopic     Status: Abnormal   Collection Time: 06/15/14 11:55 AM  Result Value Ref Range   Color, Urine YELLOW YELLOW   APPearance CLOUDY (A) CLEAR   Specific  Gravity, Urine 1.020 1.005 - 1.030   pH 8.0 5.0 - 8.0   Glucose, UA NEGATIVE NEGATIVE mg/dL   Hgb urine dipstick NEGATIVE NEGATIVE   Bilirubin Urine NEGATIVE NEGATIVE   Ketones, ur NEGATIVE NEGATIVE mg/dL   Protein, ur NEGATIVE NEGATIVE mg/dL   Urobilinogen, UA 0.2 0.0 - 1.0 mg/dL   Nitrite NEGATIVE NEGATIVE   Leukocytes, UA NEGATIVE NEGATIVE  Pregnancy, urine POC     Status: Abnormal   Collection Time: 06/15/14 12:09 PM  Result Value Ref Range   Preg Test, Ur POSITIVE (A) NEGATIVE  CBC     Status: Abnormal   Collection Time: 06/15/14  3:10 PM  Result Value Ref Range   WBC 13.9 (H) 4.0 - 10.5 K/uL   RBC 4.31 3.87 - 5.11 MIL/uL   Hemoglobin 12.3 12.0 - 15.0 g/dL   HCT 16.136.8 09.636.0 - 04.546.0 %   MCV 85.4 78.0 - 100.0 fL   MCH 28.5 26.0 - 34.0 pg   MCHC 33.4 30.0 - 36.0 g/dL   RDW 40.913.5 81.111.5 - 91.415.5 %   Platelets 347 150 - 400 K/uL  hCG, quantitative, pregnancy     Status: Abnormal   Collection Time: 06/15/14  3:10 PM  Result Value Ref Range   hCG, Beta Chain, Quant, S 71381 (H) <5 mIU/mL    [place] Ob Comp Less 14 Wks  06/15/2014   CLINICAL DATA:  Pelvic pain.  Left lower quadrant pain.  EXAM: OBSTETRIC <14 WK ULTRASOUND  TECHNIQUE: Transabdominal ultrasound was performed for evaluation of the gestation as well as the maternal uterus and adnexal regions.  COMPARISON:  None.  FINDINGS: Intrauterine gestational sac: Visualized/normal in shape.  Yolk sac:  Visualized  Embryo:  Visualized  Cardiac Activity: Visualized  Heart Rate: 172 bpm  MSD:   mm    w     d  CRL:   24.6  mm   9 w 2 d                  US EDC: 01/16/2015  Maternal uterus/adnexae: No subchorionic hemorrhage. 6.7 cm simple appearing cyst within the left ovary. Right ovary unremarkable. Trace free fluid in the pelvis.  IMPRESSION: Nine week 2 day intrauterine pregnancy. Fetal heart rate 172 beats per min. No acute maternal findings.  6.7 cm simple appearing left ovarian cyst.   Electronically Signed   By: Charlett NoseKevin  Dover M.D.   On:  06/15/2014 16:15    MAU Course  Procedures  Assessment and Plan  #1 IUP #2 Ovarian Cyst  Discussed results with patient.  Tylenol, heating pad.  Establish care with OB provider Discussed when to return - return if pain increases or develops nausea, vomiting, feeling faint.  STINSON, JACOB JEHIEL 06/15/2014, 3:24 PM

## 2014-06-15 NOTE — MAU Note (Signed)
Patient presents [redacted] weeks pregnant with complaint of left flank pain. No bleeding noted.

## 2014-07-31 LAB — OB RESULTS CONSOLE ABO/RH: RH Type: NEGATIVE

## 2014-07-31 LAB — OB RESULTS CONSOLE ANTIBODY SCREEN: Antibody Screen: POSITIVE

## 2014-07-31 LAB — OB RESULTS CONSOLE RPR: RPR: NONREACTIVE

## 2014-07-31 LAB — OB RESULTS CONSOLE RUBELLA ANTIBODY, IGM: Rubella: IMMUNE

## 2014-07-31 LAB — OB RESULTS CONSOLE HIV ANTIBODY (ROUTINE TESTING): HIV: NONREACTIVE

## 2014-07-31 LAB — OB RESULTS CONSOLE GC/CHLAMYDIA
Chlamydia: NEGATIVE
Gonorrhea: NEGATIVE

## 2014-07-31 LAB — OB RESULTS CONSOLE HEPATITIS B SURFACE ANTIGEN: Hepatitis B Surface Ag: NEGATIVE

## 2014-10-20 IMAGING — CR DG CHEST 2V
2 series · 2 of 2 positions shown · non-contrast
Comparison: None.

CLINICAL DATA: Cough and chest pain

EXAM:
CHEST  2 VIEW

[w chest pa]
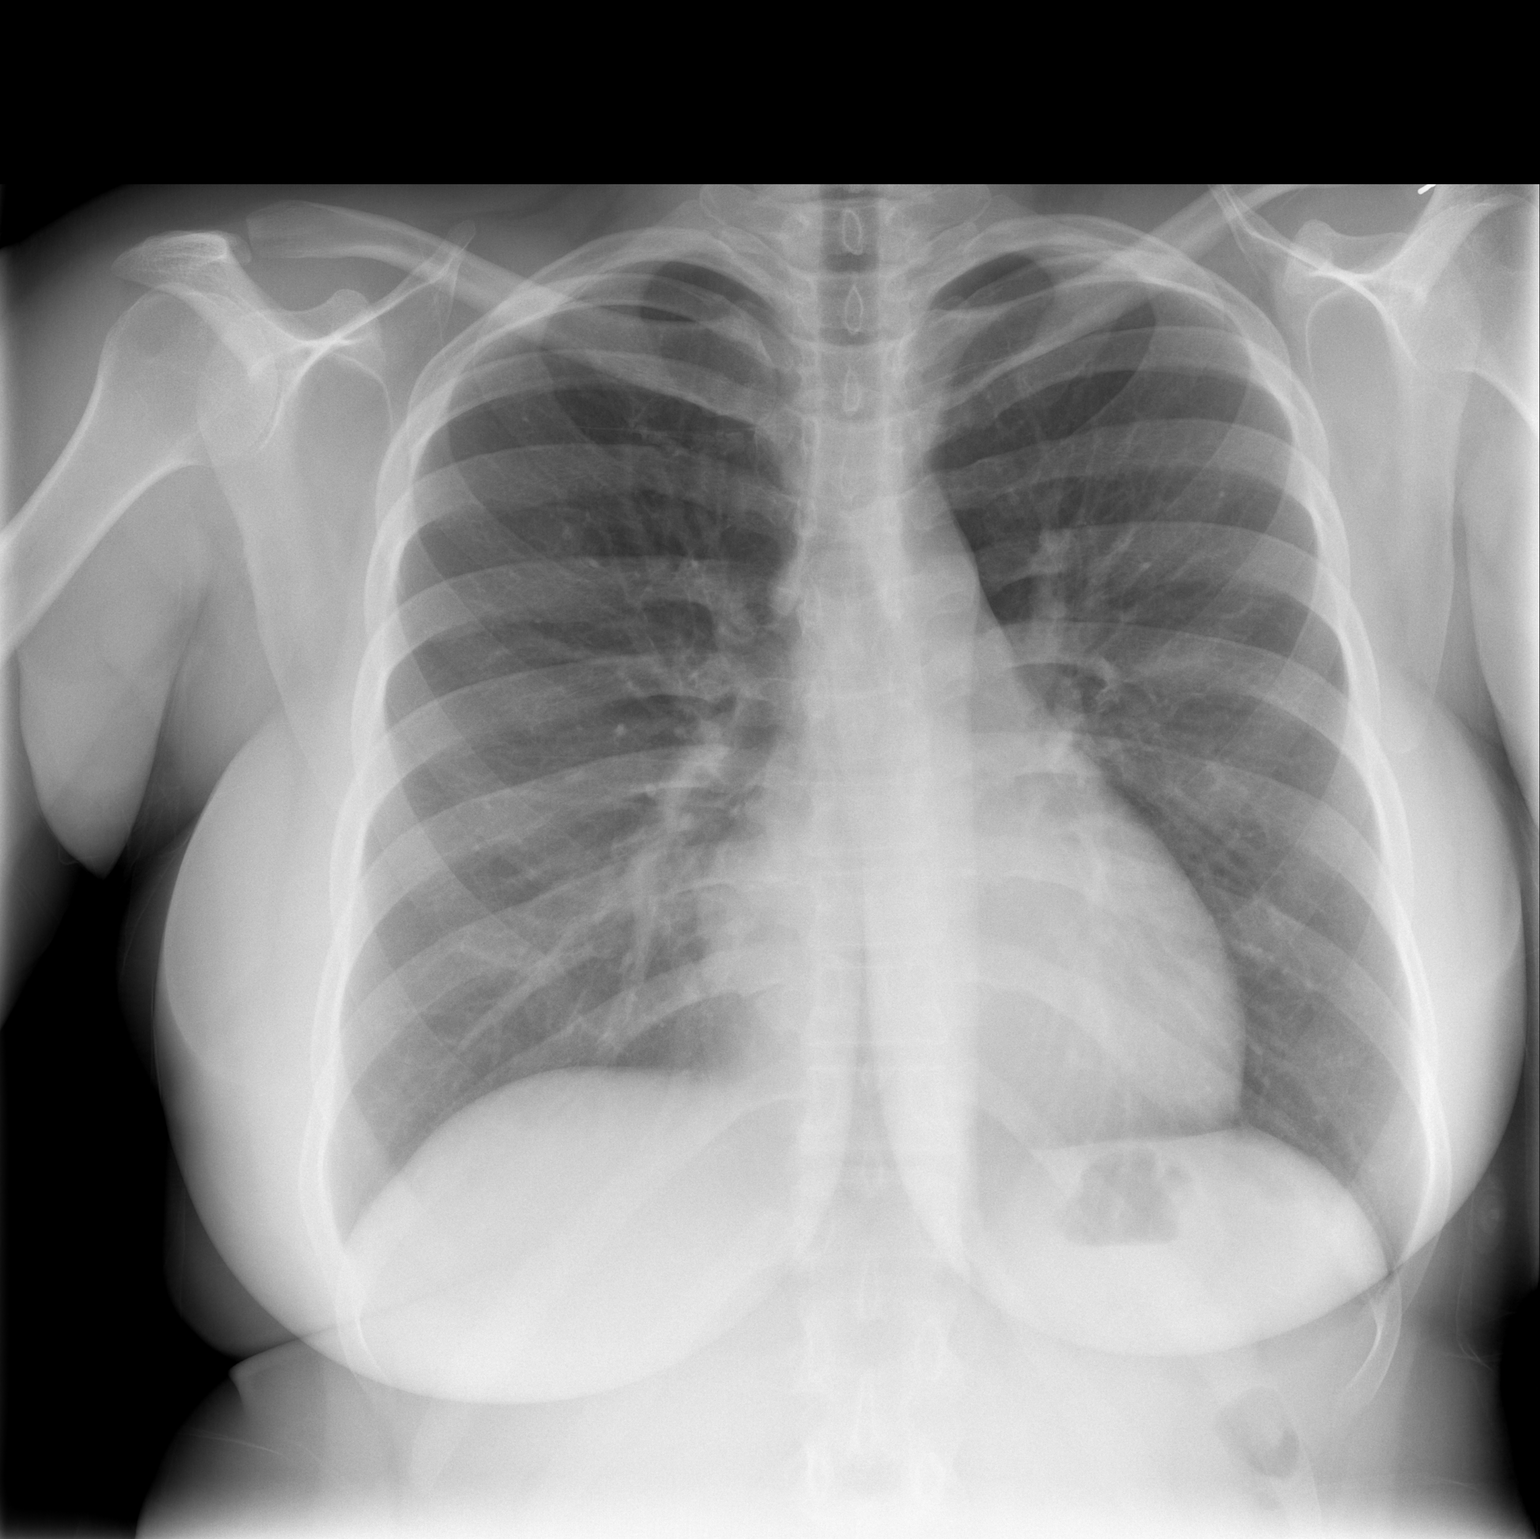

[w chest lat]
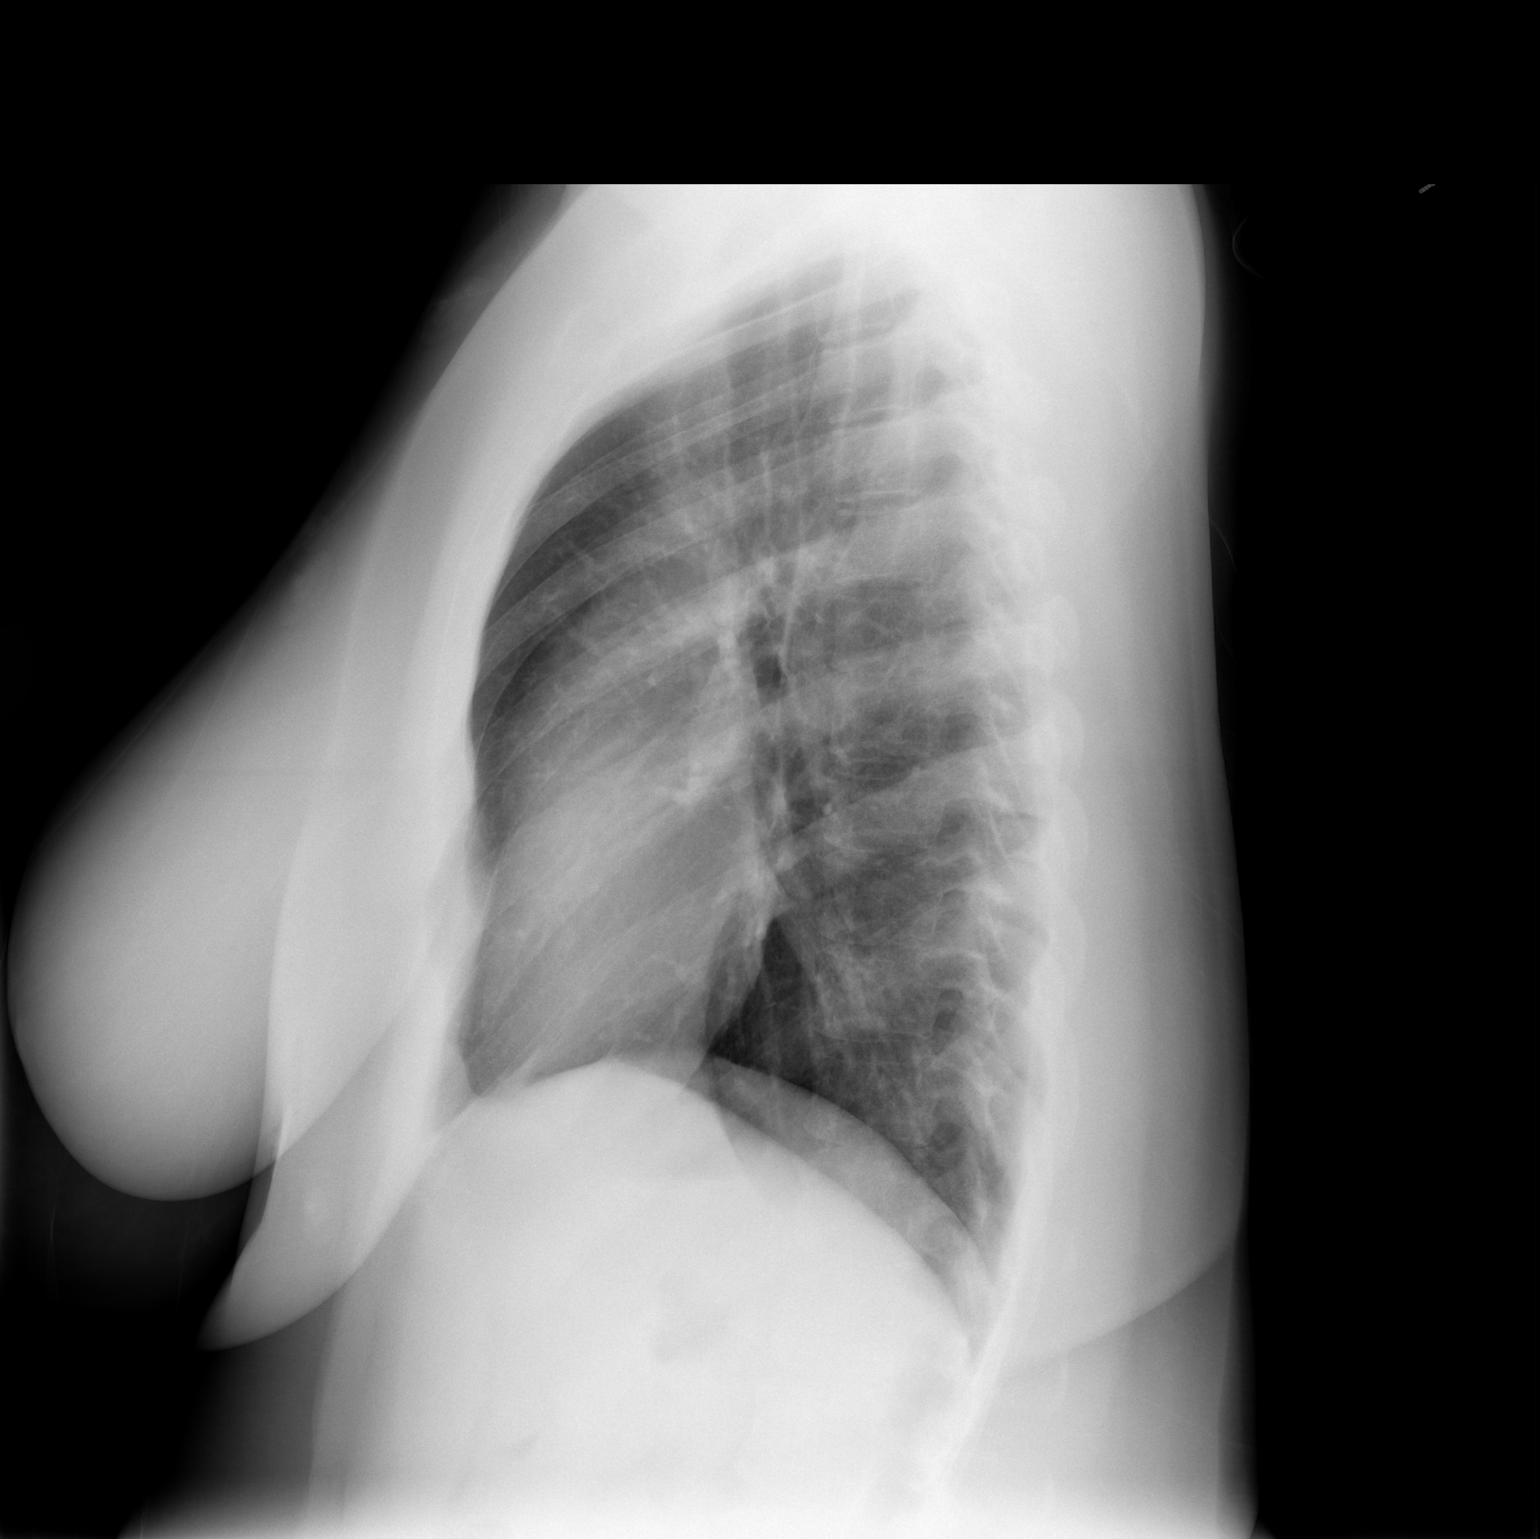

[2 of 2 positions shown; findings below may reference images not displayed]

FINDINGS: The heart size and mediastinal contours are within normal limits.
Both lungs are clear. The visualized skeletal structures are
unremarkable.
IMPRESSION: No active cardiopulmonary disease.

## 2014-12-25 LAB — OB RESULTS CONSOLE GBS: STREP GROUP B AG: POSITIVE

## 2015-01-14 ENCOUNTER — Encounter (HOSPITAL_COMMUNITY): Payer: Self-pay | Admitting: *Deleted

## 2015-01-14 ENCOUNTER — Inpatient Hospital Stay (HOSPITAL_COMMUNITY)
Admission: AD | Admit: 2015-01-14 | Discharge: 2015-01-18 | DRG: 765 | Disposition: A | Discharge status: Home / Self Care | Payer: Medicaid Other | Source: Ambulatory Visit | Attending: Obstetrics and Gynecology | Admitting: Obstetrics and Gynecology

## 2015-01-14 DIAGNOSIS — O99824 Streptococcus B carrier state complicating childbirth: Secondary | ICD-10-CM | POA: Diagnosis present

## 2015-01-14 DIAGNOSIS — Z3A39 39 weeks gestation of pregnancy: Secondary | ICD-10-CM | POA: Diagnosis present

## 2015-01-14 DIAGNOSIS — O99324 Drug use complicating childbirth: Secondary | ICD-10-CM | POA: Diagnosis present

## 2015-01-14 DIAGNOSIS — K219 Gastro-esophageal reflux disease without esophagitis: Secondary | ICD-10-CM | POA: Diagnosis present

## 2015-01-14 DIAGNOSIS — O322XX0 Maternal care for transverse and oblique lie, not applicable or unspecified: Secondary | ICD-10-CM | POA: Diagnosis present

## 2015-01-14 DIAGNOSIS — Z833 Family history of diabetes mellitus: Secondary | ICD-10-CM

## 2015-01-14 DIAGNOSIS — O4292 Full-term premature rupture of membranes, unspecified as to length of time between rupture and onset of labor: Secondary | ICD-10-CM | POA: Diagnosis present

## 2015-01-14 DIAGNOSIS — O9989 Other specified diseases and conditions complicating pregnancy, childbirth and the puerperium: Secondary | ICD-10-CM | POA: Diagnosis present

## 2015-01-14 DIAGNOSIS — O3663X0 Maternal care for excessive fetal growth, third trimester, not applicable or unspecified: Principal | ICD-10-CM | POA: Diagnosis present

## 2015-01-14 DIAGNOSIS — Z98891 History of uterine scar from previous surgery: Secondary | ICD-10-CM

## 2015-01-14 DIAGNOSIS — F1290 Cannabis use, unspecified, uncomplicated: Secondary | ICD-10-CM | POA: Diagnosis present

## 2015-01-14 DIAGNOSIS — O4290 Premature rupture of membranes, unspecified as to length of time between rupture and onset of labor, unspecified weeks of gestation: Secondary | ICD-10-CM | POA: Diagnosis present

## 2015-01-14 DIAGNOSIS — O9962 Diseases of the digestive system complicating childbirth: Secondary | ICD-10-CM | POA: Diagnosis present

## 2015-01-14 HISTORY — DX: Gastro-esophageal reflux disease without esophagitis: K21.9

## 2015-01-14 LAB — CBC
HEMATOCRIT: 37.1 % (ref 36.0–46.0)
Hemoglobin: 12.2 g/dL (ref 12.0–15.0)
MCH: 28.9 pg (ref 26.0–34.0)
MCHC: 32.9 g/dL (ref 30.0–36.0)
MCV: 87.9 fL (ref 78.0–100.0)
Platelets: 278 10*3/uL (ref 150–400)
RBC: 4.22 MIL/uL (ref 3.87–5.11)
RDW: 15.9 % — ABNORMAL HIGH (ref 11.5–15.5)
WBC: 15.6 10*3/uL — AB (ref 4.0–10.5)

## 2015-01-14 LAB — POCT FERN TEST

## 2015-01-14 LAB — AMNISURE RUPTURE OF MEMBRANE (ROM) NOT AT ARMC: Amnisure ROM: POSITIVE

## 2015-01-14 MED ORDER — OXYTOCIN 40 UNITS IN LACTATED RINGERS INFUSION - SIMPLE MED
1.0000 m[IU]/min | INTRAVENOUS | Status: DC
  Administered 2015-01-14: 2 m[IU]/min via INTRAVENOUS
  Administered 2015-01-15: 12 m[IU]/min via INTRAVENOUS
  Filled 2015-01-14: qty 1000

## 2015-01-14 MED ORDER — ONDANSETRON HCL 4 MG/2ML IJ SOLN
4.0000 mg | Freq: Four times a day (QID) | INTRAMUSCULAR | Status: DC | PRN
  Administered 2015-01-15 (×2): 4 mg via INTRAVENOUS
  Filled 2015-01-14 (×2): qty 2

## 2015-01-14 MED ORDER — PENICILLIN G POTASSIUM 5000000 UNITS IJ SOLR
5.0000 10*6.[IU] | Freq: Once | INTRAVENOUS | Status: AC
  Administered 2015-01-14: 5 10*6.[IU] via INTRAVENOUS
  Filled 2015-01-14: qty 5

## 2015-01-14 MED ORDER — TERBUTALINE SULFATE 1 MG/ML IJ SOLN: 0.2500 mg | Freq: Once | INTRAMUSCULAR | Status: AC | PRN

## 2015-01-14 MED ORDER — OXYTOCIN BOLUS FROM INFUSION: 500.0000 mL | INTRAVENOUS | Status: DC

## 2015-01-14 MED ORDER — LACTATED RINGERS IV SOLN
INTRAVENOUS | Status: DC
  Administered 2015-01-14 – 2015-01-15 (×4): via INTRAVENOUS

## 2015-01-14 MED ORDER — PENICILLIN G POTASSIUM 5000000 UNITS IJ SOLR
2.5000 10*6.[IU] | INTRAVENOUS | Status: DC
  Administered 2015-01-14 – 2015-01-15 (×7): 2.5 10*6.[IU] via INTRAVENOUS
  Filled 2015-01-14 (×10): qty 2.5

## 2015-01-14 MED ORDER — LACTATED RINGERS IV SOLN: 500.0000 mL | INTRAVENOUS | Status: DC | PRN

## 2015-01-14 MED ORDER — OXYTOCIN 40 UNITS IN LACTATED RINGERS INFUSION - SIMPLE MED: 62.5000 mL/h | INTRAVENOUS | Status: DC

## 2015-01-14 MED ORDER — BUTORPHANOL TARTRATE 1 MG/ML IJ SOLN: 1.0000 mg | INTRAMUSCULAR | Status: DC | PRN

## 2015-01-14 MED ORDER — OXYCODONE-ACETAMINOPHEN 5-325 MG PO TABS: 1.0000 | ORAL_TABLET | ORAL | Status: DC | PRN

## 2015-01-14 MED ORDER — LIDOCAINE HCL (PF) 1 % IJ SOLN: 30.0000 mL | INTRAMUSCULAR | Status: DC | PRN

## 2015-01-14 MED ORDER — OXYCODONE-ACETAMINOPHEN 5-325 MG PO TABS: 2.0000 | ORAL_TABLET | ORAL | Status: DC | PRN

## 2015-01-14 MED ORDER — CITRIC ACID-SODIUM CITRATE 334-500 MG/5ML PO SOLN
30.0000 mL | ORAL | Status: DC | PRN
  Administered 2015-01-15: 30 mL via ORAL
  Filled 2015-01-14: qty 15

## 2015-01-14 NOTE — H&P (Signed)
Cindy Mendez is a 29 y.o. female, G3 P0020, EGA 39+ weeks with EDC 7-16 presenting for eval for leaking fluidoff and on since last night.  Eval in MAU could not get + fern, but Amnisure is +.  Prenatal care complicated by S>D with EFW 9 lbs 2 oz by u/s one week ago.  Maternal Medical History:  Reason for admission: Rupture of membranes.   Contractions: Frequency: irregular.   Perceived severity is mild.    Fetal activity: Perceived fetal activity is normal.      OB History    Gravida Para Term Preterm AB TAB SAB Ectopic Multiple Living   3    2     0     Past Medical History  Diagnosis Date  . Herpes simplex labialis   . GERD (gastroesophageal reflux disease)    Past Surgical History  Procedure Laterality Date  . No past surgeries     Family History: family history includes Asthma in her father; Diabetes in her maternal grandfather; Fibromyalgia in her mother. Social History:  reports that she has never smoked. She has never used smokeless tobacco. She reports that she drinks about 1.0 - 2.0 oz of alcohol per week. She reports that she uses illicit drugs (Marijuana) about 4 times per week.   Prenatal Transfer Tool  Maternal Diabetes: No Genetic Screening: Declined Maternal Ultrasounds/Referrals: Normal Fetal Ultrasounds or other Referrals:  None Maternal Substance Abuse:  No Significant Maternal Medications:  None Significant Maternal Lab Results:  Lab values include: Group B Strep positive Other Comments:  None  Review of Systems  Respiratory: Negative.   Cardiovascular: Negative.     Dilation: 1 Effacement (%): 60 Station: -3 Exam by:: Morrison Oldee Carter RN Blood pressure 129/80, pulse 103, temperature 98 F (36.7 C), temperature source Oral, resp. rate 18, last menstrual period 04/08/2014. Maternal Exam:  Uterine Assessment: Contraction strength is mild.  Contraction frequency is irregular.   Abdomen: Patient reports no abdominal tenderness. Estimated fetal  weight is 9 lbs.   Fetal presentation: vertex  Introitus: Normal vulva. Normal vagina.  Amniotic fluid character: clear.  Pelvis: adequate for delivery.   Cervix: Cervix evaluated by digital exam.     Fetal Exam Fetal Monitor Review: Mode: ultrasound.   Variability: moderate (6-25 bpm).   Pattern: accelerations present and no decelerations.    Fetal State Assessment: Category I - tracings are normal.     Physical Exam  Vitals reviewed. Constitutional: She appears well-developed and well-nourished.  Cardiovascular: Normal rate, regular rhythm and normal heart sounds.   No murmur heard. Respiratory: Effort normal and breath sounds normal. No respiratory distress. She has no wheezes.  GI: Soft.    Prenatal labs: ABO, Rh: O/Negative/-- (01/29 0000) Antibody: Positive (01/29 0000) Rubella: Immune (01/29 0000) RPR: Nonreactive (01/29 0000)  HBsAg: Negative (01/29 0000)  HIV: Non-reactive (01/29 0000)  GBS: Positive (06/24 0000)   Assessment/Plan: IUP at 39+ weeks with PROM, +GBS, LGA baby.  Will admit when room on L&D available, start pitocin, start PCN for +GBS, monitor progress.  Anticipate SVD, would be hesitant to assist delivery due to LGA.   Marialice Newkirk D 01/14/2015, 1:00 PM

## 2015-01-14 NOTE — MAU Note (Signed)
?   SROM 2 0730 this AM; denies pain or ucs;

## 2015-01-14 NOTE — MAU Note (Signed)
Pt presents complaining of clear watery leaking of fluid that started at 0730 this morning. Denies pain or vaginal bleeding. Reports good fetal movement.

## 2015-01-14 NOTE — MAU Note (Signed)
[facility] on BS. Will call back with available room.

## 2015-01-15 ENCOUNTER — Encounter (HOSPITAL_COMMUNITY)
Admission: AD | Disposition: A | Discharge status: Home / Self Care | Payer: Self-pay | Source: Ambulatory Visit | Attending: Obstetrics and Gynecology

## 2015-01-15 ENCOUNTER — Encounter (HOSPITAL_COMMUNITY): Payer: Self-pay | Admitting: Anesthesiology

## 2015-01-15 ENCOUNTER — Inpatient Hospital Stay (HOSPITAL_COMMUNITY): Payer: Medicaid Other | Admitting: Anesthesiology

## 2015-01-15 LAB — TYPE AND SCREEN
ABO/RH(D): O NEG
Antibody Screen: NEGATIVE

## 2015-01-15 LAB — ABO/RH: ABO/RH(D): O NEG

## 2015-01-15 LAB — RPR: RPR: NONREACTIVE

## 2015-01-15 SURGERY — Surgical Case
Anesthesia: Epidural | Site: Abdomen

## 2015-01-15 MED ORDER — SODIUM CHLORIDE 0.9 % IJ SOLN: 3.0000 mL | INTRAMUSCULAR | Status: DC | PRN

## 2015-01-15 MED ORDER — KETOROLAC TROMETHAMINE 30 MG/ML IJ SOLN: 30.0000 mg | Freq: Four times a day (QID) | INTRAMUSCULAR | Status: DC | PRN

## 2015-01-15 MED ORDER — NALOXONE HCL 1 MG/ML IJ SOLN
1.0000 ug/kg/h | INTRAVENOUS | Status: DC | PRN
  Filled 2015-01-15: qty 2

## 2015-01-15 MED ORDER — SCOPOLAMINE 1 MG/3DAYS TD PT72
MEDICATED_PATCH | TRANSDERMAL | Status: AC
  Filled 2015-01-15: qty 1

## 2015-01-15 MED ORDER — SODIUM CHLORIDE 0.9 % IV SOLN
14.0000 mL/h | INTRAVENOUS | Status: DC | PRN
  Filled 2015-01-15 (×2): qty 17

## 2015-01-15 MED ORDER — SODIUM BICARBONATE 8.4 % IV SOLN
INTRAVENOUS | Status: AC
  Filled 2015-01-15: qty 50

## 2015-01-15 MED ORDER — FENTANYL 2.5 MCG/ML BUPIVACAINE 1/10 % EPIDURAL INFUSION (WH - ANES)
14.0000 mL/h | INTRAMUSCULAR | Status: DC | PRN
  Administered 2015-01-15 (×4): 14 mL/h via EPIDURAL
  Filled 2015-01-15 (×3): qty 125

## 2015-01-15 MED ORDER — OXYTOCIN 10 UNIT/ML IJ SOLN
INTRAMUSCULAR | Status: AC
  Filled 2015-01-15: qty 4

## 2015-01-15 MED ORDER — KETOROLAC TROMETHAMINE 30 MG/ML IJ SOLN
30.0000 mg | Freq: Four times a day (QID) | INTRAMUSCULAR | Status: DC | PRN
  Administered 2015-01-16: 30 mg via INTRAMUSCULAR

## 2015-01-15 MED ORDER — NALBUPHINE HCL 10 MG/ML IJ SOLN: 5.0000 mg | Freq: Once | INTRAMUSCULAR | Status: AC | PRN

## 2015-01-15 MED ORDER — MEPERIDINE HCL 25 MG/ML IJ SOLN: 6.2500 mg | INTRAMUSCULAR | Status: DC | PRN

## 2015-01-15 MED ORDER — NALBUPHINE HCL 10 MG/ML IJ SOLN: 5.0000 mg | INTRAMUSCULAR | Status: DC | PRN

## 2015-01-15 MED ORDER — ONDANSETRON HCL 4 MG/2ML IJ SOLN
INTRAMUSCULAR | Status: DC | PRN
  Administered 2015-01-15: 4 mg via INTRAVENOUS

## 2015-01-15 MED ORDER — METOCLOPRAMIDE HCL 5 MG/ML IJ SOLN
INTRAMUSCULAR | Status: DC | PRN
  Administered 2015-01-15: 10 mg via INTRAVENOUS

## 2015-01-15 MED ORDER — NALOXONE HCL 0.4 MG/ML IJ SOLN: 0.4000 mg | INTRAMUSCULAR | Status: DC | PRN

## 2015-01-15 MED ORDER — DIPHENHYDRAMINE HCL 50 MG/ML IJ SOLN: 12.5000 mg | INTRAMUSCULAR | Status: DC | PRN

## 2015-01-15 MED ORDER — SCOPOLAMINE 1 MG/3DAYS TD PT72: 1.0000 | MEDICATED_PATCH | Freq: Once | TRANSDERMAL | Status: DC

## 2015-01-15 MED ORDER — MEPERIDINE HCL 25 MG/ML IJ SOLN
INTRAMUSCULAR | Status: DC | PRN
  Administered 2015-01-15: 25 mg via INTRAVENOUS

## 2015-01-15 MED ORDER — FENTANYL CITRATE (PF) 100 MCG/2ML IJ SOLN
INTRAMUSCULAR | Status: AC
  Filled 2015-01-15: qty 2

## 2015-01-15 MED ORDER — CEFAZOLIN SODIUM-DEXTROSE 2-3 GM-% IV SOLR
2.0000 g | Freq: Once | INTRAVENOUS | Status: DC
  Filled 2015-01-15: qty 50

## 2015-01-15 MED ORDER — DEXAMETHASONE SODIUM PHOSPHATE 10 MG/ML IJ SOLN
INTRAMUSCULAR | Status: DC | PRN
  Administered 2015-01-15: 4 mg via INTRAVENOUS

## 2015-01-15 MED ORDER — OXYTOCIN 10 UNIT/ML IJ SOLN
40.0000 [IU] | INTRAMUSCULAR | Status: DC | PRN
  Administered 2015-01-15: 40 [IU] via INTRAVENOUS

## 2015-01-15 MED ORDER — FENTANYL CITRATE (PF) 100 MCG/2ML IJ SOLN
INTRAMUSCULAR | Status: DC | PRN
  Administered 2015-01-15 (×2): 50 ug via INTRAVENOUS

## 2015-01-15 MED ORDER — MORPHINE SULFATE 0.5 MG/ML IJ SOLN
INTRAMUSCULAR | Status: AC
  Filled 2015-01-15: qty 10

## 2015-01-15 MED ORDER — EPHEDRINE 5 MG/ML INJ: 10.0000 mg | INTRAVENOUS | Status: DC | PRN

## 2015-01-15 MED ORDER — LIDOCAINE-EPINEPHRINE (PF) 2 %-1:200000 IJ SOLN
INTRAMUSCULAR | Status: AC
  Filled 2015-01-15: qty 20

## 2015-01-15 MED ORDER — PHENYLEPHRINE 40 MCG/ML (10ML) SYRINGE FOR IV PUSH (FOR BLOOD PRESSURE SUPPORT)
80.0000 ug | PREFILLED_SYRINGE | INTRAVENOUS | Status: DC | PRN
Start: 2015-01-15 — End: 2015-01-16
  Filled 2015-01-15: qty 20

## 2015-01-15 MED ORDER — PROMETHAZINE HCL 25 MG/ML IJ SOLN: 6.2500 mg | INTRAMUSCULAR | Status: DC | PRN

## 2015-01-15 MED ORDER — DIPHENHYDRAMINE HCL 25 MG PO CAPS
25.0000 mg | ORAL_CAPSULE | ORAL | Status: DC | PRN
  Filled 2015-01-15: qty 1

## 2015-01-15 MED ORDER — LACTATED RINGERS IV SOLN
INTRAVENOUS | Status: DC | PRN
  Administered 2015-01-15 (×2): via INTRAVENOUS

## 2015-01-15 MED ORDER — KETOROLAC TROMETHAMINE 30 MG/ML IJ SOLN
INTRAMUSCULAR | Status: AC
  Filled 2015-01-15: qty 1

## 2015-01-15 MED ORDER — ACETAMINOPHEN 500 MG PO TABS: 1000.0000 mg | ORAL_TABLET | Freq: Four times a day (QID) | ORAL | Status: DC

## 2015-01-15 MED ORDER — ONDANSETRON HCL 4 MG/2ML IJ SOLN: 4.0000 mg | Freq: Three times a day (TID) | INTRAMUSCULAR | Status: DC | PRN

## 2015-01-15 MED ORDER — MEPERIDINE HCL 25 MG/ML IJ SOLN
INTRAMUSCULAR | Status: AC
  Filled 2015-01-15: qty 1

## 2015-01-15 MED ORDER — LIDOCAINE HCL (PF) 1 % IJ SOLN
INTRAMUSCULAR | Status: DC | PRN
  Administered 2015-01-15: 9 mL
  Administered 2015-01-15: 8 mL

## 2015-01-15 MED ORDER — MORPHINE SULFATE (PF) 0.5 MG/ML IJ SOLN
INTRAMUSCULAR | Status: DC | PRN
  Administered 2015-01-15: 4 mg via EPIDURAL

## 2015-01-15 MED ORDER — ONDANSETRON HCL 4 MG/2ML IJ SOLN
INTRAMUSCULAR | Status: AC
  Filled 2015-01-15: qty 2

## 2015-01-15 MED ORDER — FENTANYL CITRATE (PF) 100 MCG/2ML IJ SOLN: 25.0000 ug | INTRAMUSCULAR | Status: DC | PRN

## 2015-01-15 MED ORDER — LIDOCAINE-EPINEPHRINE (PF) 2 %-1:200000 IJ SOLN
INTRAMUSCULAR | Status: DC | PRN
  Administered 2015-01-15 (×2): 5 mL via EPIDURAL

## 2015-01-15 SURGICAL SUPPLY — 36 items
APL SKNCLS STERI-STRIP NONHPOA (GAUZE/BANDAGES/DRESSINGS)
BENZOIN TINCTURE PRP APPL 2/3 (GAUZE/BANDAGES/DRESSINGS) IMPLANT
CLAMP CORD UMBIL (MISCELLANEOUS) IMPLANT
CLOSURE WOUND 1/2 X4 (GAUZE/BANDAGES/DRESSINGS) ×1
CLOTH BEACON ORANGE TIMEOUT ST (SAFETY) ×3 IMPLANT
DRAPE SHEET LG 3/4 BI-LAMINATE (DRAPES) IMPLANT
DRSG OPSITE POSTOP 4X10 (GAUZE/BANDAGES/DRESSINGS) ×3 IMPLANT
DURAPREP 26ML APPLICATOR (WOUND CARE) ×3 IMPLANT
ELECT REM PT RETURN 9FT ADLT (ELECTROSURGICAL) ×3
ELECTRODE REM PT RTRN 9FT ADLT (ELECTROSURGICAL) ×1 IMPLANT
EXTRACTOR VACUUM KIWI (MISCELLANEOUS) IMPLANT
GLOVE BIO SURGEON STRL SZ 6.5 (GLOVE) ×2 IMPLANT
GLOVE BIO SURGEONS STRL SZ 6.5 (GLOVE) ×1
GOWN STRL REUS W/TWL LRG LVL3 (GOWN DISPOSABLE) ×6 IMPLANT
KIT ABG SYR 3ML LUER SLIP (SYRINGE) IMPLANT
NDL HYPO 25X5/8 SAFETYGLIDE (NEEDLE) IMPLANT
NEEDLE HYPO 25X5/8 SAFETYGLIDE (NEEDLE) IMPLANT
NS IRRIG 1000ML POUR BTL (IV SOLUTION) ×3 IMPLANT
PACK C SECTION WH (CUSTOM PROCEDURE TRAY) ×3 IMPLANT
PAD OB MATERNITY 4.3X12.25 (PERSONAL CARE ITEMS) ×3 IMPLANT
RTRCTR C-SECT PINK 25CM LRG (MISCELLANEOUS) ×3 IMPLANT
STRIP CLOSURE SKIN 1/2X4 (GAUZE/BANDAGES/DRESSINGS) ×1 IMPLANT
SUT CHROMIC 1 CTX 36 (SUTURE) ×6 IMPLANT
SUT PLAIN 0 NONE (SUTURE) IMPLANT
SUT PLAIN 2 0 XLH (SUTURE) ×3 IMPLANT
SUT VIC AB 0 CT1 27 (SUTURE) ×6
SUT VIC AB 0 CT1 27XBRD ANBCTR (SUTURE) ×2 IMPLANT
SUT VIC AB 2-0 CT1 27 (SUTURE) ×3
SUT VIC AB 2-0 CT1 TAPERPNT 27 (SUTURE) ×1 IMPLANT
SUT VIC AB 3-0 CT1 27 (SUTURE)
SUT VIC AB 3-0 CT1 TAPERPNT 27 (SUTURE) IMPLANT
SUT VIC AB 3-0 SH 27 (SUTURE) ×3
SUT VIC AB 3-0 SH 27X BRD (SUTURE) IMPLANT
SUT VIC AB 4-0 KS 27 (SUTURE) ×3 IMPLANT
TOWEL OR 17X24 6PK STRL BLUE (TOWEL DISPOSABLE) ×3 IMPLANT
TRAY FOLEY CATH SILVER 14FR (SET/KITS/TRAYS/PACK) ×3 IMPLANT

## 2015-01-15 NOTE — Progress Notes (Signed)
Patient ID: Cindy Mendez, female   DOB: 10/23/1985, 29 y.o.   MRN: [id number] Pt comfortable with epidural afeb vss FHR category 1 IUPC placed to assess contractions Cervix 4+/80/-2  D/w her latent phase labor Given LGA baby we discussed not performing any aggressive operative vaginal delivery Will follow progress, adjust pitocin as needed, on 20mu

## 2015-01-15 NOTE — Anesthesia Postprocedure Evaluation (Signed)
  Anesthesia Post-op Note  Patient: Cindy Mendez  Procedure(s) Performed: Procedure(s): CESAREAN SECTION (N/A)  Patient Location: PACU  Anesthesia Type:Epidural  Level of Consciousness: awake and alert   Airway and Oxygen Therapy: Patient Spontanous Breathing  Post-op Pain: none  Post-op Assessment: Post-op Vital signs reviewed, Patient's Cardiovascular Status Stable, Respiratory Function Stable, Patent Airway and No signs of Nausea or vomiting LLE Motor Response: Purposeful movement LLE Sensation: Tingling RLE Motor Response: Purposeful movement RLE Sensation: Tingling      Post-op Vital Signs: Reviewed and stable  Last Vitals:  Filed Vitals:   01/15/15 2330  BP: 118/61  Pulse:   Temp: 36.7 C  Resp:     Complications: No apparent anesthesia complications

## 2015-01-15 NOTE — Progress Notes (Signed)
Patient ID: Cindy Mendez, female   DOB: 08/08/1985, 29 y.o.   MRN: [id number] Pt sleeping and comfortable FHR Category 1 Cervix 80/8/-1 to -2  No change in about 2 hours--d/w pt arrest of dilation with known LGA baby  D/w pt risks and benfits of c-section in detail including bleeding, infection and possible damage to bowel and bladder.   Pt is agreeable to proceed.  Pitocin off to rest uterus while OR gets ready

## 2015-01-15 NOTE — Progress Notes (Signed)
Patient ID: Cindy Mendez, female   DOB: 02/13/1986, 29 y.o.   MRN: [id number] Comfortable overall, some back pain afeb vss FHR category 1 Cervix c/8/-1 OP  Will try sims and peanut to help baby rotate Recheck in 2 hours

## 2015-01-15 NOTE — Anesthesia Preprocedure Evaluation (Signed)
Anesthesia Evaluation  Patient identified by MRN, date of birth, ID band Patient awake    Reviewed: Allergy & Precautions, H&P , NPO status , Patient's Chart, lab work & pertinent test results  Airway Mallampati: II  TM Distance: >3 FB Neck ROM: full    Dental no notable dental hx.    Pulmonary former smoker,    Pulmonary exam normal       Cardiovascular negative cardio ROS Normal cardiovascular exam    Neuro/Psych negative neurological ROS  negative psych ROS   GI/Hepatic Neg liver ROS,   Endo/Other  diabetes  Renal/GU negative Renal ROS     Musculoskeletal   Abdominal (+) + obese,   Peds  Hematology negative hematology ROS (+)   Anesthesia Other Findings   Reproductive/Obstetrics (+) Pregnancy                             Anesthesia Physical Anesthesia Plan  ASA: III  Anesthesia Plan: Epidural   Post-op Pain Management:    Induction:   Airway Management Planned:   Additional Equipment:   Intra-op Plan:   Post-operative Plan:   Informed Consent: I have reviewed the patients History and Physical, chart, labs and discussed the procedure including the risks, benefits and alternatives for the proposed anesthesia with the patient or authorized representative who has indicated his/her understanding and acceptance.     Plan Discussed with:   Anesthesia Plan Comments:         Anesthesia Quick Evaluation

## 2015-01-15 NOTE — Progress Notes (Signed)
Patient ID: Cindy GuttingChaunte D Mates, female   DOB: 09/11/1985, 29 y.o.   MRN: [id number] Late entry note Pt comfortable with epidural and resting when examined at 1130am Cervix unchanged 4+/80/-1  Slightly puffy cervix IUPC showed contractions not adequate so attempted resting uterus and then halfing dose to titrate up as needed FHR overall reassuring with good variability some accels occasional mild variables

## 2015-01-15 NOTE — Transfer of Care (Signed)
Immediate Anesthesia Transfer of Care Note  Patient: Cindy Mendez  Procedure(s) Performed: Procedure(s): CESAREAN SECTION (N/A)  Patient Location: PACU  Anesthesia Type:Epidural  Level of Consciousness: awake, alert , oriented and patient cooperative  Airway & Oxygen Therapy: Patient Spontanous Breathing  Post-op Assessment: Report given to RN and Post -op Vital signs reviewed and stable  Post vital signs: Reviewed and stable  Last Vitals:  BP 109/70 HR 80 RR 14 POX 99 TEMP 98.1   Complications: No apparent anesthesia complications

## 2015-01-15 NOTE — Progress Notes (Signed)
Patient ID: Cindy Mendez, female   DOB: 05/27/1986, 29 y.o.   MRN: [id number] Pt comfortable with epidural afeb vss FHR category 1 Cervix 8/80/-1 with contraction, -2 without IUPC with MVU at 110-175, Ptiocin on 28 mu  Pt with minimal change in 2 hours, d/w her progress is slow over these last 2 hours and May mean the baby is too large to descend.  We will give it another hour or so and see if anything Progresses per her request

## 2015-01-15 NOTE — Op Note (Signed)
Operative Note    Preoperative Diagnosis Term pregnancy at 39 6/7 weeks LGA by US Arrest of dilation   Postoperative Diagnosis Same with fetal macrosomia  Procedure Low Transverse C-section with 2 layer closure of uterus  Surgeon Huel CoteKathy Erlean Mealor  Anesthesia Epidural  Fluids: EBL  800cc UOP  200cc blood tinged (from prior to procedure) IVF   1200cc LR  Findings There was a viable female infant.  Vertex, transverse lie.  Apgars 8,9 Weight 9#13oz Nml uterus tubes and ovaries  Specimen Placenta to L&D  Procedure Note  Patient was taken to the operating room where epidural anesthesia was found to be adequate by Allis clamp test. She was prepped and draped in the normal sterile fashion in the dorsal supine position with a leftward tilt. An appropriate time out was performed. A Pfannenstiel skin incision was then made with the scalpel and carried through to the underlying layer of fascia by sharp dissection and Bovie cautery. The fascia was nicked in the midline and the incision was extended laterally with Mayo scissors. The inferior aspect of the incision was grasped Coker clamps and dissected off the underlying rectus muscles. In a similar fashion the superior aspect was dissected off the rectus muscles. Rectus muscles were separated in the midline and the peritoneal cavity entered bluntly. The peritoneal incision was then extended both superiorly and inferiorly with careful attention to avoid both bowel and bladder. The Alexis self-retaining wound retractor was then placed within the incision and the lower uterine segment exposed. The bladder flap was developed with Metzenbaum scissors and pushed away from the lower uterine segment. The lower uterine segment was then incised in a transverse fashion and the cavity itself entered bluntly. The incision was extended bluntly. The infant's head was then lifted and delivered from the incision without difficulty. The remainder of the infant  delivered and the nose and mouth bulb suctioned with the cord clamped and cut as well. The infant was handed off to the waiting pediatricians. The placenta was then spontaneously expressed from the uterus and the uterus cleared of all clots and debris with moist lap sponge. The uterine incision was then repaired in 2 layers the first layer was a running locked layer 1-0 chromic and the second an imbricating layer of the same suture. Two additional areas of bleeding at the left angle were repaired with 2-0 and 3-0 vicryl for hemostasis. The tubes and ovaries were inspected and the gutters cleared of all clots and debris. The uterine incision was inspected and found to be hemostatic. All instruments and sponges as well as the Alexis retractor were then removed from the abdomen. The rectus muscles and peritoneum were then reapproximated with a running suture of 2-0 Vicryl. The fascia was then closed with 0 Vicryl in a running fashion. Subcutaneous tissue was reapproximated with 3-0 plain in a running fashion. The skin was closed with a subcuticular stitch of 4-0 Vicryl on a Keith needle and then reinforced with benzoin and Steri-Strips. At the conclusion of the procedure all instruments and sponge counts were correct. Patient was taken to the recovery room in good condition with her baby accompanying her skin to skin.

## 2015-01-15 NOTE — Anesthesia Procedure Notes (Signed)
Epidural Patient location during procedure: OB Start time: 01/15/2015 2:28 AM End time: 01/15/2015 2:32 AM  Staffing Anesthesiologist: Leilani AbleHATCHETT, Evangaline Jou  Preanesthetic Checklist Completed: patient identified, surgical consent, pre-op evaluation, timeout performed, IV checked, risks and benefits discussed and monitors and equipment checked  Epidural Patient position: sitting Prep: site prepped and draped and DuraPrep Patient monitoring: continuous pulse ox and blood pressure Approach: midline Location: L3-L4 Injection technique: LOR air  Needle:  Needle type: Tuohy  Needle gauge: 17 G Needle length: 9 cm and 9 Needle insertion depth: 7 cm Catheter type: closed end flexible Catheter size: 19 Gauge Catheter at skin depth: 12 cm Test dose: negative and Other  Assessment Sensory level: T9 Events: blood not aspirated, injection not painful, no injection resistance, negative IV test and no paresthesia  Additional Notes Reason for block:procedure for pain

## 2015-01-16 DIAGNOSIS — Z98891 History of uterine scar from previous surgery: Secondary | ICD-10-CM

## 2015-01-16 LAB — CBC
HCT: 33.5 % — ABNORMAL LOW (ref 36.0–46.0)
Hemoglobin: 11.1 g/dL — ABNORMAL LOW (ref 12.0–15.0)
MCH: 28.8 pg (ref 26.0–34.0)
MCHC: 33.1 g/dL (ref 30.0–36.0)
MCV: 87 fL (ref 78.0–100.0)
PLATELETS: 248 10*3/uL (ref 150–400)
RBC: 3.85 MIL/uL — AB (ref 3.87–5.11)
RDW: 15.8 % — ABNORMAL HIGH (ref 11.5–15.5)
WBC: 29 10*3/uL — AB (ref 4.0–10.5)

## 2015-01-16 MED ORDER — METOCLOPRAMIDE HCL 5 MG/ML IJ SOLN
10.0000 mg | Freq: Once | INTRAMUSCULAR | Status: AC
  Administered 2015-01-16: 10 mg via INTRAVENOUS
  Filled 2015-01-16: qty 2

## 2015-01-16 MED ORDER — LACTATED RINGERS IV SOLN
INTRAVENOUS | Status: DC
  Administered 2015-01-16: 10:00:00 via INTRAVENOUS

## 2015-01-16 MED ORDER — LANOLIN HYDROUS EX OINT: 1.0000 "application " | TOPICAL_OINTMENT | CUTANEOUS | Status: DC | PRN

## 2015-01-16 MED ORDER — OXYCODONE-ACETAMINOPHEN 5-325 MG PO TABS
1.0000 | ORAL_TABLET | ORAL | Status: DC | PRN
  Administered 2015-01-17 – 2015-01-18 (×4): 1 via ORAL
  Filled 2015-01-16 (×4): qty 1

## 2015-01-16 MED ORDER — RHO D IMMUNE GLOBULIN 1500 UNIT/2ML IJ SOSY
300.0000 ug | PREFILLED_SYRINGE | Freq: Once | INTRAMUSCULAR | Status: AC
  Administered 2015-01-16: 300 ug via INTRAVENOUS
  Filled 2015-01-16: qty 2

## 2015-01-16 MED ORDER — ZOLPIDEM TARTRATE 5 MG PO TABS: 5.0000 mg | ORAL_TABLET | Freq: Every evening | ORAL | Status: DC | PRN

## 2015-01-16 MED ORDER — MENTHOL 3 MG MT LOZG: 1.0000 | LOZENGE | OROMUCOSAL | Status: DC | PRN

## 2015-01-16 MED ORDER — WITCH HAZEL-GLYCERIN EX PADS: 1.0000 "application " | MEDICATED_PAD | CUTANEOUS | Status: DC | PRN

## 2015-01-16 MED ORDER — DIBUCAINE 1 % RE OINT: 1.0000 "application " | TOPICAL_OINTMENT | RECTAL | Status: DC | PRN

## 2015-01-16 MED ORDER — DIPHENHYDRAMINE HCL 25 MG PO CAPS: 25.0000 mg | ORAL_CAPSULE | Freq: Four times a day (QID) | ORAL | Status: DC | PRN

## 2015-01-16 MED ORDER — IBUPROFEN 600 MG PO TABS
600.0000 mg | ORAL_TABLET | Freq: Four times a day (QID) | ORAL | Status: DC
  Administered 2015-01-16 – 2015-01-18 (×9): 600 mg via ORAL
  Filled 2015-01-16 (×9): qty 1

## 2015-01-16 MED ORDER — PRENATAL MULTIVITAMIN CH
1.0000 | ORAL_TABLET | Freq: Every day | ORAL | Status: DC
  Administered 2015-01-16 – 2015-01-17 (×2): 1 via ORAL
  Filled 2015-01-16 (×2): qty 1

## 2015-01-16 MED ORDER — SIMETHICONE 80 MG PO CHEW
80.0000 mg | CHEWABLE_TABLET | ORAL | Status: DC
  Administered 2015-01-16 – 2015-01-18 (×2): 80 mg via ORAL
  Filled 2015-01-16 (×2): qty 1

## 2015-01-16 MED ORDER — ONDANSETRON HCL 4 MG/2ML IJ SOLN
4.0000 mg | Freq: Four times a day (QID) | INTRAMUSCULAR | Status: DC | PRN
  Administered 2015-01-16: 4 mg via INTRAVENOUS
  Filled 2015-01-16: qty 2

## 2015-01-16 MED ORDER — SIMETHICONE 80 MG PO CHEW
80.0000 mg | CHEWABLE_TABLET | Freq: Three times a day (TID) | ORAL | Status: DC
  Administered 2015-01-16 – 2015-01-18 (×6): 80 mg via ORAL
  Filled 2015-01-16 (×6): qty 1

## 2015-01-16 MED ORDER — SENNOSIDES-DOCUSATE SODIUM 8.6-50 MG PO TABS
2.0000 | ORAL_TABLET | ORAL | Status: DC
  Administered 2015-01-16 – 2015-01-18 (×2): 2 via ORAL
  Filled 2015-01-16 (×2): qty 2

## 2015-01-16 MED ORDER — ACETAMINOPHEN 325 MG PO TABS: 650.0000 mg | ORAL_TABLET | ORAL | Status: DC | PRN

## 2015-01-16 MED ORDER — OXYTOCIN 40 UNITS IN LACTATED RINGERS INFUSION - SIMPLE MED: 62.5000 mL/h | INTRAVENOUS | Status: AC

## 2015-01-16 MED ORDER — SIMETHICONE 80 MG PO CHEW: 80.0000 mg | CHEWABLE_TABLET | ORAL | Status: DC | PRN

## 2015-01-16 MED ORDER — TETANUS-DIPHTH-ACELL PERTUSSIS 5-2.5-18.5 LF-MCG/0.5 IM SUSP: 0.5000 mL | Freq: Once | INTRAMUSCULAR | Status: DC

## 2015-01-16 MED ORDER — OXYCODONE-ACETAMINOPHEN 5-325 MG PO TABS: 2.0000 | ORAL_TABLET | ORAL | Status: DC | PRN

## 2015-01-16 MED ORDER — ALBUTEROL SULFATE (2.5 MG/3ML) 0.083% IN NEBU: 3.0000 mL | INHALATION_SOLUTION | Freq: Four times a day (QID) | RESPIRATORY_TRACT | Status: DC | PRN

## 2015-01-16 NOTE — Progress Notes (Signed)
Subjective: Postpartum Day 1 Cesarean Delivery Patient reports nausea and incisional pain.    Objective: Vital signs in last 24 hours: Temp:  [97.8 F (36.6 C)-99.1 F (37.3 C)] 98 F (36.7 C) (07/16 0430) Pulse Rate:  [58-95] 58 (07/16 0430) Resp:  [16-23] 18 (07/16 0430) BP: (99-134)/(52-83) 113/52 mmHg (07/16 0430) SpO2:  [92 %-100 %] 92 % (07/16 0430)  Physical Exam:  General: alert and cooperative Lochia: appropriate Uterine Fundus: firm Incision: C/D/I    Recent Labs  01/14/15 1255 01/16/15 0520  HGB 12.2 11.1*  HCT 37.1 33.5*    Assessment/Plan: Status post Cesarean section. Pt still having some post-operative N/V, will try a dose of Reglan    Cindy Mendez W 01/16/2015, 9:35 AM

## 2015-01-16 NOTE — Anesthesia Postprocedure Evaluation (Signed)
Anesthesia Post Note  Patient: Cindy Mendez  Procedure(s) Performed: Procedure(s) (LRB): CESAREAN SECTION (N/A)  Anesthesia type: Epidural  Patient location: Mother/Baby  Post pain: Pain level controlled  Post assessment: Post-op Vital signs reviewed  Last Vitals:  Filed Vitals:   01/16/15 0430  BP: 113/52  Pulse: 58  Temp: 36.7 C  Resp: 18    Post vital signs: Reviewed  Level of consciousness:alert  Complications: No apparent anesthesia complications

## 2015-01-16 NOTE — Addendum Note (Signed)
Addendum  created 01/16/15 1121 by Graciela HusbandsWynn O Silveria Botz, CRNA   Modules edited: Notes Section   Notes Section:  File: [id number]

## 2015-01-16 NOTE — Lactation Note (Signed)
This note was copied from the chart of Cindy Mendez. Lactation Consultation Note  Patient Name: Cindy Mendez AOZHY'QToday's Date: 01/16/2015 Reason for consult: Initial assessment Baby 18 hours old, lying on mom's lap asleep. Mom states that baby nursing well and her nurses have been helping her a lot. Mom states that baby prefers to nurse in cross-cradle on left breast and football on right. Mom states that she is seeing colostrum and baby seems satisfied when she comes off breast. [facility], aware of OP/BFSG, community resources, and [facility] after [facility]   Maternal Data Has patient been taught Hand Expression?: Yes (Per mom.) Does the patient have breastfeeding experience prior to this delivery?: No  Feeding Feeding Type: Breast Fed  LATCH Score/Interventions                      Lactation Tools Discussed/Used     Consult Status Consult Status: Follow-up Date: 01/17/15 Follow-up type: In-patient    Geralynn OchsWILLIARD, Catalena Stanhope 01/16/2015, 4:59 PM

## 2015-01-17 LAB — RH IG WORKUP (INCLUDES ABO/RH)
ABO/RH(D): O NEG
Fetal Screen: NEGATIVE
Gestational Age(Wks): 40
Unit division: 0

## 2015-01-17 NOTE — Lactation Note (Signed)
This note was copied from the chart of Cindy Mendez. Lactation Consultation Note  Patient Name: Cindy Clista BernhardtChaunte Rotan ZOXWR'UToday's Date: 01/17/2015 Reason for consult: Follow-up assessment Baby 47 hours old. Mom states that baby is latching to breasts well, but that baby cluster-fed through the night last night and into today and she did "breakdown and give formula." Discussed supply and demand and enc mom to use either hand pump or DEBP in order to stimulate breasts and provide EBM for supplementation instead of formula. Demonstrated use of hand pump with drops of colostrum flowing, and mom return-demonstrated hand expression with drops of colostrum readily flowing as well. Enc mom to put baby to breast first with cues, and then supplement with EBM/formula, then use hand pump/DEBP and hand expression after baby fed. Discussed with mom that as her milk comes in she can give EBM instead of formula. Mom states that she is relieved to see that she has good flow of EBM and a plan for increasing the flow. Mom aware of OP/BFSG and [facility] phone line assistance after D/C.   Maternal Data    Feeding Feeding Type: Bottle Fed - Formula Length of feed: 50 min  LATCH Score/Interventions Latch: Grasps breast easily, tongue down, lips flanged, rhythmical sucking.  Audible Swallowing: A few with stimulation  Type of Nipple: Everted at rest and after stimulation  Comfort (Breast/Nipple): Soft / non-tender     Hold (Positioning): No assistance needed to correctly position infant at breast.  LATCH Score: 9  Lactation Tools Discussed/Used     Consult Status Consult Status: Follow-up Date: 01/18/15 Follow-up type: In-patient    Geralynn OchsWILLIARD, Brei Pociask 01/17/2015, 9:48 PM

## 2015-01-17 NOTE — Progress Notes (Signed)
Subjective: Postpartum Day 2: Cesarean Delivery Patient reports incisional pain, tolerating PO and no problems voiding.    Objective: Vital signs in last 24 hours: Temp:  [98 F (36.7 C)-98.7 F (37.1 C)] 98.5 F (36.9 C) (07/17 0535) Pulse Rate:  [61-71] 70 (07/17 0535) Resp:  [18-20] 18 (07/17 0535) BP: (99-115)/(52-88) 99/56 mmHg (07/17 0535) SpO2:  [96 %-98 %] 98 % (07/16 2230)  Physical Exam:  General: alert Lochia: appropriate Uterine Fundus: firm Incision: clean, dry and intact DVT Evaluation: No evidence of DVT seen on physical exam.   Recent Labs  01/14/15 1255 01/16/15 0520  HGB 12.2 11.1*  HCT 37.1 33.5*    Assessment/Plan: Status post Cesarean section. Doing well postoperatively.  Continue current care.  Oliver PilaICHARDSON,Riggs Dineen W 01/17/2015, 10:01 AM

## 2015-01-17 NOTE — Discharge Summary (Signed)
Obstetric Discharge Summary Reason for Admission: rupture of membranes Prenatal Procedures: none Intrapartum Procedures: cesarean: low cervical, transverse Postpartum Procedures: none Complications-Operative and Postpartum: none HEMOGLOBIN  Date Value Ref Range Status  01/16/2015 11.1* 12.0 - 15.0 g/dL Final   HCT  Date Value Ref Range Status  01/16/2015 33.5* 36.0 - 46.0 % Final    Physical Exam:  General: alert, cooperative and no distress Lochia: appropriate Uterine Fundus: firm Incision: clean, dry and intact DVT Evaluation: No evidence of DVT seen on physical exam.  Discharge Diagnoses: Term Pregnancy-delivered  Discharge Information: Date: 01/18/2015 Activity: pelvic rest Diet: routine Medications: Ibuprofen and Percocet Condition: stable Instructions: refer to practice specific booklet Discharge to: home Follow-up Information    Follow up with Oliver PilaICHARDSON,Jermya Dowding W, MD In 2 weeks.   Specialty:  Obstetrics and Gynecology   Why:  For incision check and 6weeks for postpartum visit   Contact information:   [address] [place] [address] [phone]       Newborn Data: Live born female  Birth Weight: 9 lb 12.6 oz (4440 g) APGAR: 8, 9  Home with mother.  Oliver PilaICHARDSON,Caera Enwright W 01/18/2015, 8:40 AM

## 2015-01-18 ENCOUNTER — Ambulatory Visit: Payer: Self-pay

## 2015-01-18 ENCOUNTER — Encounter (HOSPITAL_COMMUNITY): Payer: Self-pay | Admitting: *Deleted

## 2015-01-18 MED ORDER — OXYCODONE-ACETAMINOPHEN 5-325 MG PO TABS: 1.0000 | ORAL_TABLET | ORAL | Status: AC | PRN

## 2015-01-18 MED ORDER — IBUPROFEN 600 MG PO TABS: 600.0000 mg | ORAL_TABLET | Freq: Four times a day (QID) | ORAL | Status: AC

## 2015-01-18 NOTE — Progress Notes (Signed)
UR chart review completed.  

## 2015-01-18 NOTE — Clinical Social Work Maternal (Signed)
CLINICAL SOCIAL WORK MATERNAL/CHILD NOTE  Patient Details  Name: Cindy Mendez MRN: [id number] Date of Birth: [birth year]  Date:  01/18/2015  Clinical Social Worker Initiating Note:  Loleta Books, LCSW Date/ Time Initiated:  01/18/15/0915     Child's Name:  Cindy Mendez   Legal Guardian:  Jackquline Denmark (mother) and Oretha Caprice (father)  Need for Interpreter:  None   Date of Referral:  01/18/15     Reason for Referral:  Current Substance Use/Substance Use During Pregnancy    Referral Source:  [facility]   Address:  [address] [place], [place] [address]  Phone number:  [phone]   Household Members:  Parents   Natural Supports (not living in the home):  Spouse/significant other, Extended Family, Immediate Family   Professional Supports: None   Employment:   did not assess  Type of Work:   N/A  Education:    N/A  Architect:  Medicaid   Other Resources:  Sales executive , [employer]   Cultural/Religious Considerations Which May Impact Care:  None reported  Strengths:  Ability to meet basic needs , Pediatrician chosen , Home prepared for child    Risk Factors/Current Problems:   1)Substance Use: [name] presents with weekly THC use during the pregnancy. Infant's UDS is negative and MDS is pending.   Cognitive State:  Able to Concentrate , Alert , Linear Thinking , Insightful    Mood/Affect:  Calm , Comfortable , Bright , Interested    CSW Assessment:  CSW received request for consult due to [name] presenting with a history of marijuana use during pregnancy.  [name] presented as easily engaged and receptive to the visit. She was noted to be in a pleasant mood and displayed a full range in affect.  [name] did not present any acute mental health symptoms, and presented as coping well with transition to postpartum.  FOB was also present in the room, and was noted to be attentive and providing care to the infant.    [name] presented as reflected as she processed  her thoughts and feelings secondary to her transition to the postpartum period.  [name] discussed range of emotions that she experienced when she first learned that was pregnant since she was not instantly feeling happy and excited.  [name] shared that she participated in child birth classes with the [facility] which was helpful since her feelings were normalized, and she realized that was not a bad mother if she was not instantly happy as she adjusted to the news of the pregnancy and how her life would subsequently unfold. She shared that she found this realization helpful since it eliminated feelings of guilt.  [name] shared that she has since become excited and is looking forward to becoming a mother; however, she stated that she approached the hospital with a plan and desire to have a vaginal birth. She shared that she continues to adjust to having a C-Section, has felt supported by staff about the need to have the procedure, but indicated residual feelings of loss secondary to change in the birthing plan.  She stated that she is attempting to focus on the positive aspect of having a healthy infant, but acknowledged that she continues to have other feelings of loss.   [name] shared that she feels well supported by her family and significant other as she continues to transition to motherhood. She stated that she normally lives with her parents, but reported that she will be staying with the FOB postpartum. [name] reported that  the home is prepared and she knows that she has people she can call and contact if needed.   [name] reported that her medical providers are aware of how she was feeling early in the pregnancy. She stated that she has already been notified that symptoms of postpartum depression/anxiety can have onset in the pregnancy.  [name] presented as receptive to education on perinatal mood and anxiety disorders, and agreed to continue to be self-aware related to how she is feeling and to notify her provider of any signs or  symptoms.    [name] reports history of weekly THC use during the pregnancy. She stated that she had "less" in comparison to pre-pregnancy.  [name] and feelings of stress. Per [name], last use was 2-3 days ago.  [name] denied additional substance use history.  CSW provided education on infant hospital drug screen policy. [name] verbalized understanding, and denied questions or concerns related to collection of infant's urine and meconium. Family aware that CPS will be contacted if there is a positive drug screen. CSW continued to provide education secondary to what to anticipate with CPS involvement if there is a positive drug screen. Family denied questions or concerns, and reported that their intention has always been to be honest.    [name] and FOB denied additional questions, concerns, or needs at this time. [name] expressed appreciation for the visit, and agreed to contact CSW if additional needs arise prior to discharge.  CSW Plan/Description:   1)Patient/Family Education: Perinatal mood and anxiety disorders, hospital drug screen policy 2) CSW notes that infant's UDS is negative. CSW to monitor infant's MDS and will notify CPS if there is a positive drug screen.  3)No Further Intervention Required/No Barriers to Discharge    Kelby FamVenning, Amin Fornwalt N, LCSW 01/18/2015, 11:41 AM

## 2015-01-18 NOTE — Progress Notes (Signed)
Subjective: Postpartum Day 3: Cesarean Delivery Patient reports incisional pain, tolerating PO and no problems voiding.    Objective: Vital signs in last 24 hours: Temp:  [98.2 F (36.8 C)-98.5 F (36.9 C)] 98.5 F (36.9 C) (07/18 0535) Pulse Rate:  [75-78] 78 (07/18 0535) Resp:  [18] 18 (07/18 0535) BP: (99-107)/(68) 99/68 mmHg (07/18 0535)  Physical Exam:  General: alert and cooperative Lochia: appropriate Uterine Fundus: firm Incision: C/D/I    Recent Labs  01/16/15 0520  HGB 11.1*  HCT 33.5*    Assessment/Plan: Status post Cesarean section. Doing well postoperatively.  Discharge home with standard precautions and return to office in 2 weeks.  Oliver PilaRICHARDSON,Detrice Cales W 01/18/2015, 8:38 AM

## 2015-01-18 NOTE — Lactation Note (Signed)
This note was copied from the chart of Cindy Lovetta Orvan FalconerCampbell. Lactation Consultation Note; Mothers nipples semi-flat. Assist mother with stimulation to firm nipples. Mother assist with latching infant on in cross cradle hold. Infant sustained latch with good depth and audible swallows. Observed feeding on alternate breast. Advised mother to continue to breastfeed 8-12 times in 24 hours. Mother advised in treatment to prevent severe engorgement. Mother is aware of available LC services and community support.   Patient Name: Cindy Clista BernhardtChaunte Timothy JXBJY'NToday's Date: 01/18/2015     Maternal Data    Feeding    LATCH Score/Interventions                      Lactation Tools Discussed/Used     Consult Status      Cindy Mendez, Cindy Mendez 01/18/2015, 3:50 PM

## 2015-01-19 NOTE — Addendum Note (Signed)
Addendum  created 01/19/15 1324 by Leilani AbleFranklin Raygan Skarda, MD   Modules edited: Anesthesia Responsible Staff

## 2015-10-16 IMAGING — US US OB COMP LESS 14 WK
1 series · 14 of 28 positions shown · non-contrast
Comparison: None.

CLINICAL DATA: Pelvic pain.  Left lower quadrant pain.

EXAM:
OBSTETRIC <14 WK ULTRASOUND
TECHNIQUE: Transabdominal ultrasound was performed for evaluation of the
gestation as well as the maternal uterus and adnexal regions.

[Series 1: us ob transvaginal · 14 of 33 slices shown]
[im 2/33]
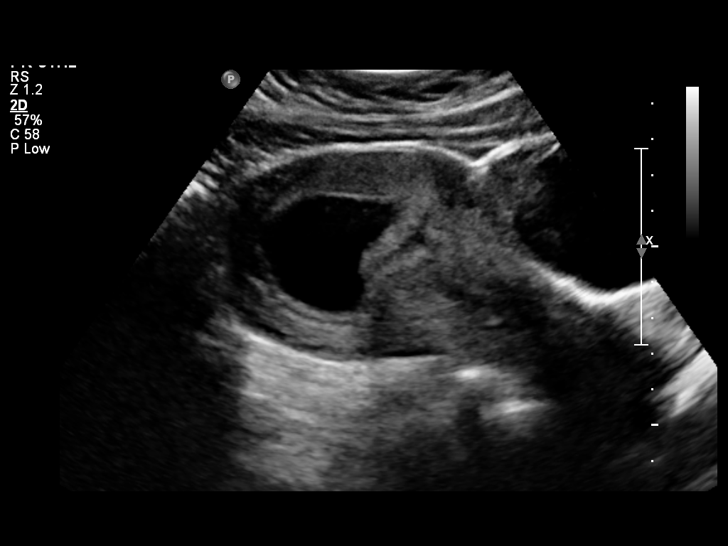
[im 4/33]
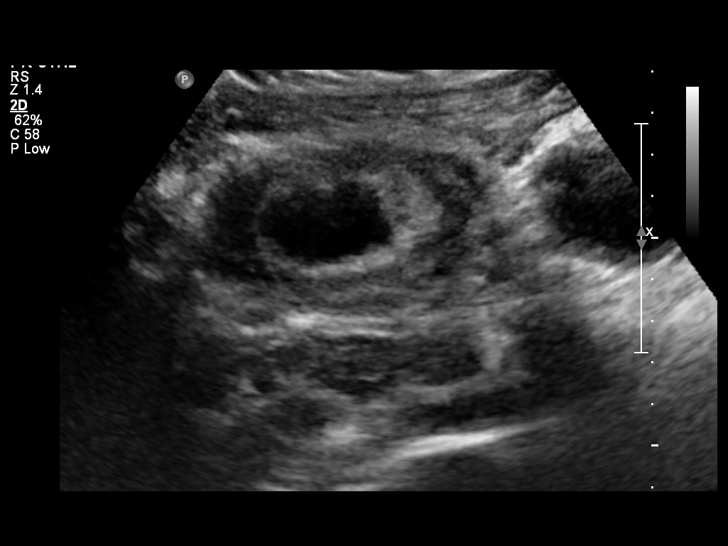
[im 6/33]
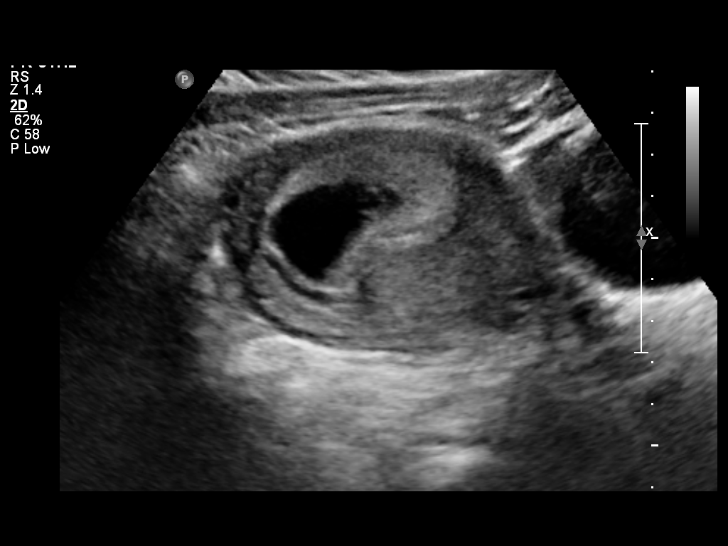
[im 9/33]
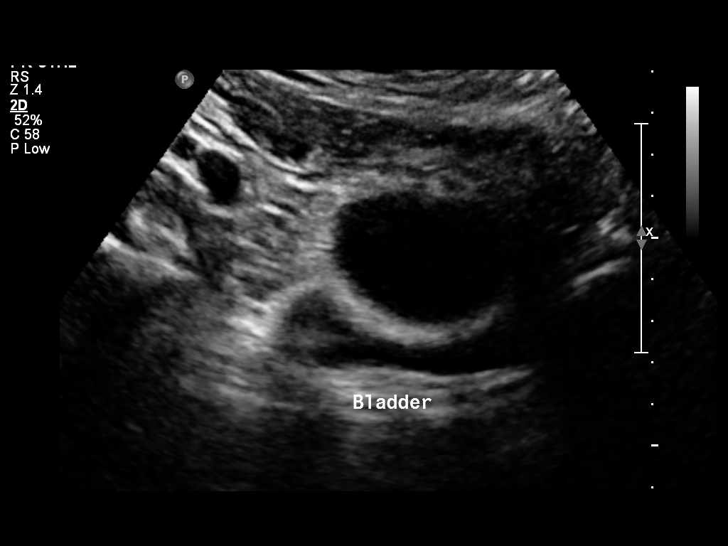
[im 11/33]
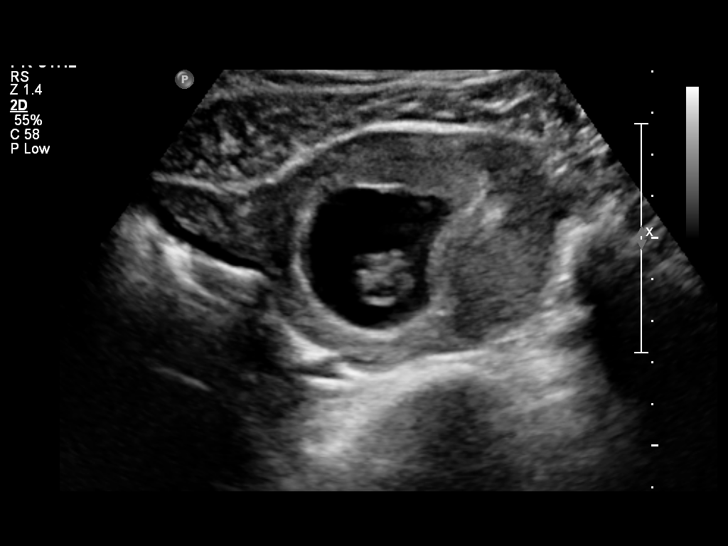
[im 14/33]
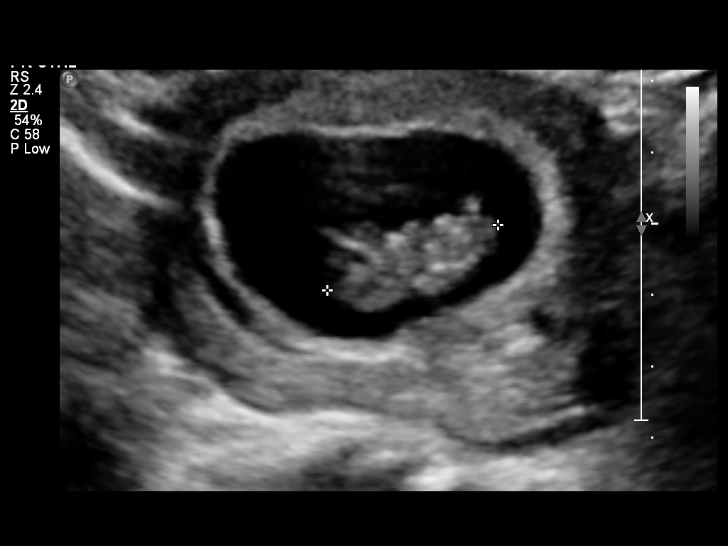
[im 16/33]
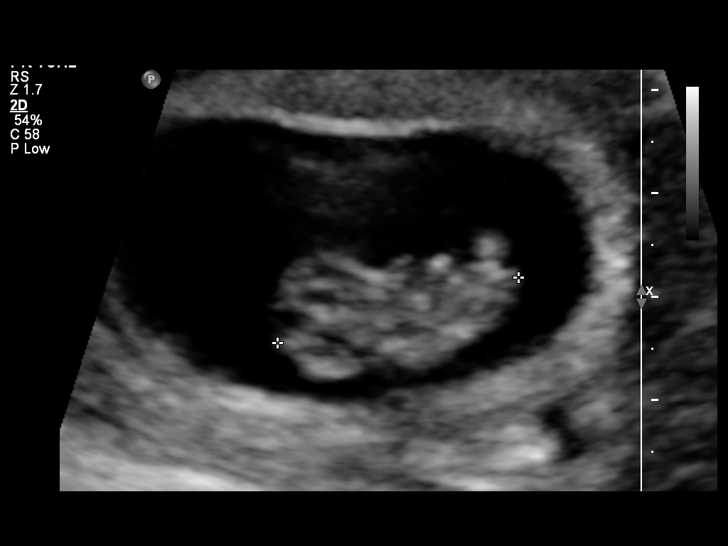
[im 18/33]
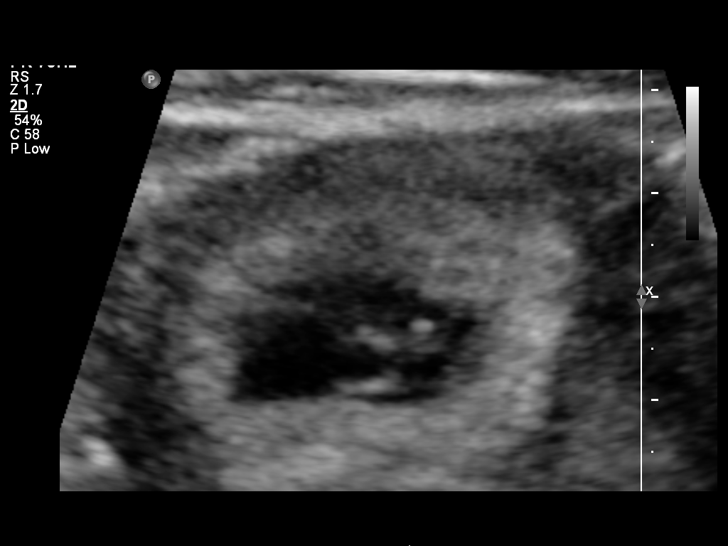
[im 21/33]
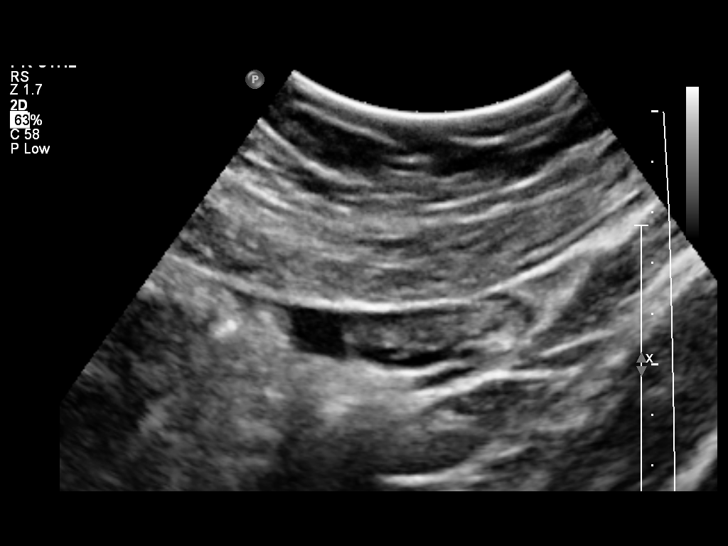
[im 23/33]
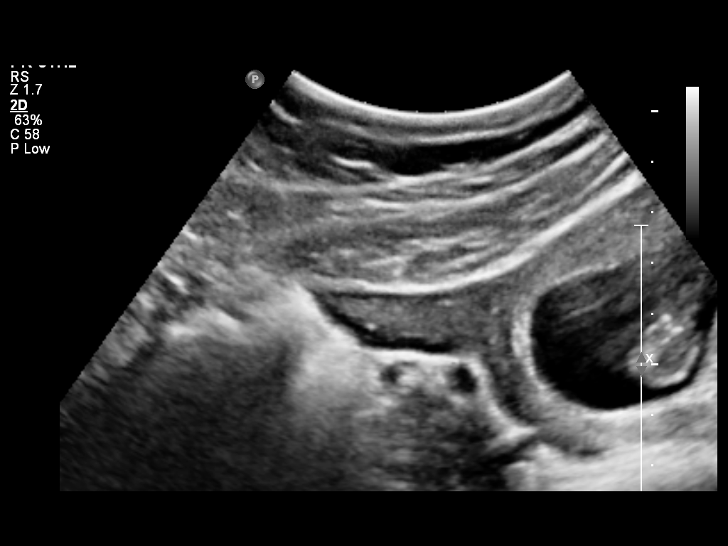
[im 25/33]
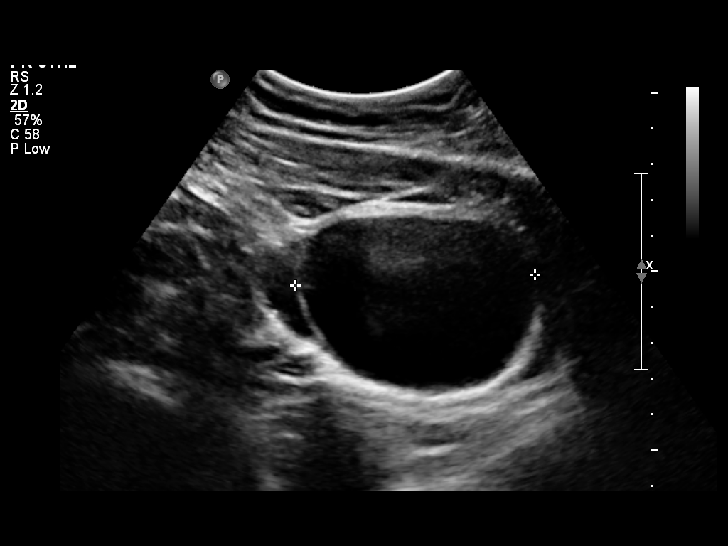
[im 28/33]
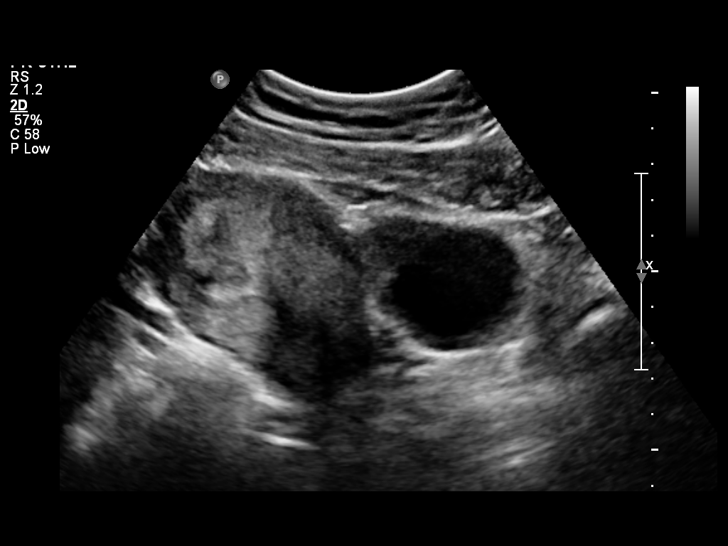
[im 30/33]
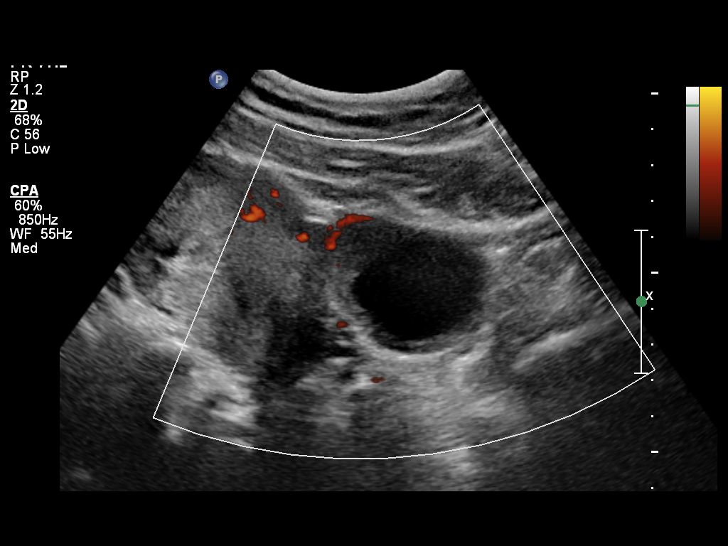
[im 33/33]
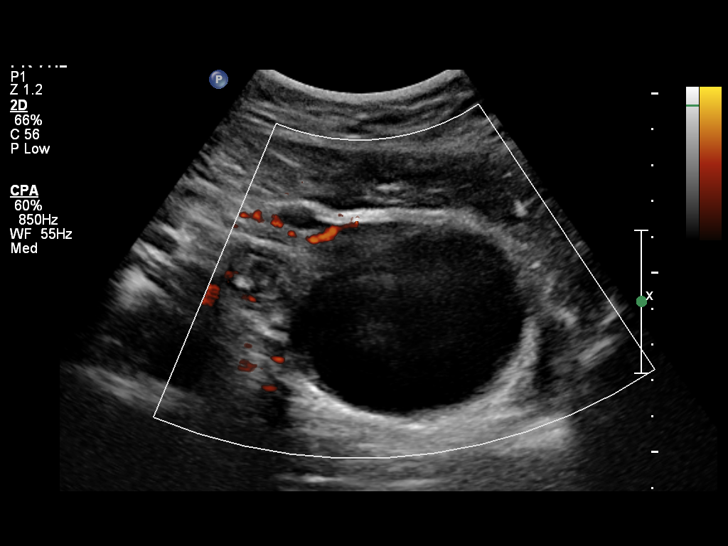

[14 of 28 positions shown; findings below may reference images not displayed]

FINDINGS: Intrauterine gestational sac: Visualized/normal in shape.

Yolk sac:  Visualized

Embryo:  Visualized

Cardiac Activity: Visualized

Heart Rate: 172 bpm

MSD:   mm    w     d

CRL:   24.6  mm   9 w 2 d                  US EDC: 01/16/2015

Maternal uterus/adnexae: No subchorionic hemorrhage. 6.7 cm simple
appearing cyst within the left ovary. Right ovary unremarkable.
Trace free fluid in the pelvis.
IMPRESSION: Nine week 2 day intrauterine pregnancy. Fetal heart rate 172 beats
per min. No acute maternal findings.

6.7 cm simple appearing left ovarian cyst.

## 2022-11-16 ENCOUNTER — Encounter: Payer: Self-pay | Admitting: Physical Medicine and Rehabilitation

## 2022-11-16 ENCOUNTER — Other Ambulatory Visit (INDEPENDENT_AMBULATORY_CARE_PROVIDER_SITE_OTHER): Payer: Medicaid Other

## 2022-11-16 ENCOUNTER — Ambulatory Visit: Payer: Medicaid Other | Admitting: Physical Medicine and Rehabilitation

## 2022-11-16 DIAGNOSIS — M7918 Myalgia, other site: Secondary | ICD-10-CM

## 2022-11-16 DIAGNOSIS — M47816 Spondylosis without myelopathy or radiculopathy, lumbar region: Secondary | ICD-10-CM

## 2022-11-16 DIAGNOSIS — M545 Low back pain, unspecified: Secondary | ICD-10-CM

## 2022-11-16 DIAGNOSIS — G8929 Other chronic pain: Secondary | ICD-10-CM

## 2022-11-16 MED ORDER — MELOXICAM 15 MG PO TABS: 15.0000 mg | ORAL_TABLET | Freq: Every day | ORAL | 0 refills | Status: AC

## 2022-11-16 MED ORDER — MELOXICAM 15 MG PO TABS: 15.0000 mg | ORAL_TABLET | Freq: Every day | ORAL | 0 refills | Status: DC

## 2022-11-16 NOTE — Progress Notes (Signed)
Functional Pain Scale - descriptive words and definitions  Moderate (4)   Constantly aware of pain, can complete ADLs with modification/sleep marginally affected at times/passive distraction is of no use, but active distraction gives some relief. Moderate range order  Average Pain  varies on activity  Lower back pain across the back with some radiation in the right leg

## 2022-11-16 NOTE — Progress Notes (Signed)
Cindy Mendez - 37 y.o. female MRN [id number]  Date of birth: 08/23/1985  Office Visit Note: Visit Date: 11/16/2022 PCP: Quita Skye, PA-C Referred by: Quita Skye, PA-C  Subjective: Chief Complaint  Patient presents with   Lower Back - Pain   HPI: Cindy Mendez is a 37 y.o. female who comes in today per the request of Quita Skye, [place] for evaluation of chronic, worsening and severe bilateral lower back pain. Pain ongoing for 2 years, worsens with bending, prolonged sitting and standing to perform household tasks. She describes pain as sore and aching sensation, currently rates as 1 out of 10. Some relief of pain with home exercise regimen, rest and use of medications. Her primary care provider recently placed referral to [facility], her initial evaluation is 11/29/2022. No history of lumbar surgery/injections. Patient feels large breast size is contributing to her pain, ultimate goal for her is to undergo breast reduction. States she does have special needs daughter that she cares for. Patient denies focal weakness, numbness and tingling. No recent trauma or falls noted.    Oswestry Disability Index Score 12% 0 to 10 (20%)   minimal disability: The patient can cope with most living activities. Usually no treatment is indicated apart from advice on lifting sitting and exercise.  Review of Systems  Musculoskeletal:  Positive for back pain and myalgias.  Neurological:  Negative for tingling, sensory change, focal weakness and weakness.  All other systems reviewed and are negative.  Otherwise per HPI.  Assessment & Plan: Visit Diagnoses:    ICD-10-CM   1. Chronic bilateral low back pain without sciatica  M54.50 XR Lumbar Spine 2-3 Views   G89.29     2. Spondylosis without myelopathy or radiculopathy, lumbar region  M47.816     3. Myofascial pain syndrome  M79.18        Plan: Findings:  Chronic, worsening and severe bilateral lower back pain. No radicular symptoms.  Patient continues to have severe pain despite good conservative therapies such as home exercise regimen, rest and use of medications. Patients clinical presentation and exam are consistent with myofascial pain syndrome. Tenderness noted to bilateral thoracic and lumbar paraspinal regions upon exam today. Other differentials include lumbar facet mediated pain. I did obtain lumbar x-rays in the office today that show lumbarized S1, possible pars defects at this level. Mild degenerative changes and biforaminal narrowing to lower lumbar spine. Patient is scheduled to start formal physical therapy in the coming weeks. I would like for her to complete PT as I feel she would benefit from manual treatments, core strengthening and establishing consistent home exercise regimen. I did prescribe short course of Meloxicam for patient to take while in PT. I would like to see her back in approximately 6 weeks for re-evaluation. If her pain persists next step would likely be obtaining lumbar MRI imaging. No red flag symptoms noted upon exam today.   Dr. Alvester Morin participated with direct patient care including clinical review, exam when needed and significant portion of diagnostic and treatment plan.       Meds & Orders:  Meds ordered this encounter  Medications   DISCONTD: meloxicam (MOBIC) 15 MG tablet    Sig: Take 1 tablet (15 mg total) by mouth daily.    Dispense:  30 tablet    Refill:  0   meloxicam (MOBIC) 15 MG tablet    Sig: Take 1 tablet (15 mg total) by mouth daily.    Dispense:  30 tablet  Refill:  0    Orders Placed This Encounter  Procedures   XR Lumbar Spine 2-3 Views    Follow-up: No follow-ups on file.   Procedures: No procedures performed      Clinical History: No specialty comments available.   She reports that she quit smoking about 8 years ago. She smoked an average of .5 packs per day. She has never used smokeless tobacco. No results for input(s): "HGBA1C", "LABURIC" in the last  8760 hours.  Objective:  VS:  HT:    WT:   BMI:     BP:   HR: bpm  TEMP: ( )  RESP:  Physical Exam Vitals and nursing note reviewed.  HENT:     Head: Normocephalic and atraumatic.     Right Ear: External ear normal.     Left Ear: External ear normal.     Nose: Nose normal.     Mouth/Throat:     Mouth: Mucous membranes are moist.  Eyes:     Extraocular Movements: Extraocular movements intact.  Cardiovascular:     Rate and Rhythm: Normal rate.     Pulses: Normal pulses.  Pulmonary:     Effort: Pulmonary effort is normal.  Abdominal:     General: Abdomen is flat. There is no distension.  Musculoskeletal:        General: Tenderness present.     Cervical back: Normal range of motion.     Comments: Patient rises from seated position to standing without difficulty. Good lumbar range of motion. No pain noted with facet loading. 5/5 strength noted with bilateral hip flexion, knee flexion/extension, ankle dorsiflexion/plantarflexion and EHL. No clonus noted bilaterally. No pain upon palpation of greater trochanters. No pain with internal/external rotation of bilateral hips. Sensation intact bilaterally. Tenderness noted to bilateral thoracic and lumbar paraspinal regions upon exam today. Negative slump test bilaterally. Ambulates without aid, gait steady.     Skin:    General: Skin is warm and dry.     Capillary Refill: Capillary refill takes less than 2 seconds.  Neurological:     General: No focal deficit present.     Mental Status: She is alert and oriented to person, place, and time.  Psychiatric:        Mood and Affect: Mood normal.        Behavior: Behavior normal.     Ortho Exam  Imaging: No results found.  Past Medical/Family/Surgical/Social History: Medications & Allergies reviewed per EMR, new medications updated. Patient Active Problem List   Diagnosis Date Noted   S/P primary low transverse C-section 01/16/2015   PROM (premature rupture of membranes) 01/14/2015    Herpes simplex labialis    Past Medical History:  Diagnosis Date   GERD (gastroesophageal reflux disease)    Herpes simplex labialis    Family History  Problem Relation Age of Onset   Diabetes Maternal Grandfather    Fibromyalgia Mother    Asthma Father    Past Surgical History:  Procedure Laterality Date   CESAREAN SECTION N/A 01/15/2015   Procedure: CESAREAN SECTION;  Surgeon: Huel Cote, MD;  Location: [facility];  Service: Obstetrics;  Laterality: N/A;   NO PAST SURGERIES     Social History   Occupational History   Occupation: Forensic psychologist  Tobacco Use   Smoking status: Former    Packs/day: .5    Types: Cigarettes    Quit date: 05/03/2014    Years since quitting: 8.5   Smokeless tobacco: Never  Substance and Sexual  Activity   Alcohol use: Yes    Alcohol/week: 2.0 - 4.0 standard drinks of alcohol    Types: 2 - 4 Standard drinks or equivalent per week   Drug use: Yes    Frequency: 4.0 times per week    Types: Marijuana   Sexual activity: Yes    Birth control/protection: None

## 2022-11-22 ENCOUNTER — Other Ambulatory Visit: Payer: Self-pay | Admitting: Physical Medicine and Rehabilitation

## 2022-12-29 ENCOUNTER — Telehealth: Payer: Self-pay | Admitting: Physical Medicine and Rehabilitation

## 2022-12-29 NOTE — Telephone Encounter (Signed)
Patient cancelled for appointment on 01/01/23

## 2022-12-29 NOTE — Telephone Encounter (Signed)
Patient called needing to cancel her appointment Patient said she have not had (PT) yet  [phone]

## 2023-01-01 ENCOUNTER — Ambulatory Visit: Payer: Medicaid Other | Admitting: Physical Medicine and Rehabilitation
